# Patient Record
Sex: Male | Born: 1980
Health system: Southern US, Community
[De-identification: ages and names within clinical notes are randomized; demographics above are authoritative.]

## PROBLEM LIST (undated history)

## (undated) DIAGNOSIS — T7840XA Allergy, unspecified, initial encounter: Secondary | ICD-10-CM

## (undated) HISTORY — DX: Allergy, unspecified, initial encounter: T78.40XA

---

## 2009-03-18 ENCOUNTER — Ambulatory Visit: Payer: Self-pay | Admitting: Family Medicine

## 2009-07-21 ENCOUNTER — Ambulatory Visit: Payer: Self-pay | Admitting: Internal Medicine

## 2014-09-17 ENCOUNTER — Ambulatory Visit: Payer: Federal, State, Local not specified - PPO | Admitting: Anesthesiology

## 2014-09-17 ENCOUNTER — Encounter
Admission: RE | Disposition: A | Discharge status: Home / Self Care | Payer: Self-pay | Source: Ambulatory Visit | Attending: Gastroenterology

## 2014-09-17 ENCOUNTER — Ambulatory Visit
Admission: RE | Admit: 2014-09-17 | Discharge: 2014-09-17 | Disposition: A | Discharge status: Home / Self Care | Payer: Federal, State, Local not specified - PPO | Source: Ambulatory Visit | Attending: Gastroenterology | Admitting: Gastroenterology

## 2014-09-17 DIAGNOSIS — Z800 Family history of malignant neoplasm of digestive organs: Secondary | ICD-10-CM | POA: Diagnosis not present

## 2014-09-17 DIAGNOSIS — R197 Diarrhea, unspecified: Secondary | ICD-10-CM | POA: Diagnosis not present

## 2014-09-17 DIAGNOSIS — Z880 Allergy status to penicillin: Secondary | ICD-10-CM | POA: Insufficient documentation

## 2014-09-17 DIAGNOSIS — K648 Other hemorrhoids: Secondary | ICD-10-CM | POA: Diagnosis not present

## 2014-09-17 DIAGNOSIS — K621 Rectal polyp: Secondary | ICD-10-CM | POA: Diagnosis not present

## 2014-09-17 DIAGNOSIS — R194 Change in bowel habit: Secondary | ICD-10-CM | POA: Insufficient documentation

## 2014-09-17 DIAGNOSIS — Z882 Allergy status to sulfonamides status: Secondary | ICD-10-CM | POA: Insufficient documentation

## 2014-09-17 DIAGNOSIS — Z8379 Family history of other diseases of the digestive system: Secondary | ICD-10-CM | POA: Diagnosis not present

## 2014-09-17 DIAGNOSIS — K529 Noninfective gastroenteritis and colitis, unspecified: Secondary | ICD-10-CM | POA: Diagnosis present

## 2014-09-17 HISTORY — PX: COLONOSCOPY WITH PROPOFOL: SHX5780

## 2014-09-17 SURGERY — COLONOSCOPY WITH PROPOFOL
Anesthesia: General

## 2014-09-17 MED ORDER — MIDAZOLAM HCL 5 MG/5ML IJ SOLN
INTRAMUSCULAR | Status: DC | PRN
  Administered 2014-09-17: 2 mg via INTRAVENOUS
  Administered 2014-09-17: 1 mg via INTRAVENOUS

## 2014-09-17 MED ORDER — PROPOFOL INFUSION 10 MG/ML OPTIME
INTRAVENOUS | Status: DC | PRN
  Administered 2014-09-17: 160 ug/kg/min via INTRAVENOUS

## 2014-09-17 MED ORDER — LACTATED RINGERS IV SOLN
INTRAVENOUS | Status: DC | PRN
  Administered 2014-09-17: 11:00:00 via INTRAVENOUS

## 2014-09-17 MED ORDER — FENTANYL CITRATE (PF) 100 MCG/2ML IJ SOLN
INTRAMUSCULAR | Status: DC | PRN
  Administered 2014-09-17: 50 ug via INTRAVENOUS

## 2014-09-17 MED ORDER — PROPOFOL 10 MG/ML IV BOLUS
INTRAVENOUS | Status: DC | PRN
  Administered 2014-09-17: 60 mg via INTRAVENOUS

## 2014-09-17 MED ORDER — SODIUM CHLORIDE 0.9 % IV SOLN
INTRAVENOUS | Status: DC
  Administered 2014-09-17: 1000 mL via INTRAVENOUS

## 2014-09-17 MED ORDER — SODIUM CHLORIDE 0.9 % IV SOLN: INTRAVENOUS | Status: DC

## 2014-09-17 NOTE — Anesthesia Preprocedure Evaluation (Signed)
Anesthesia Evaluation  Patient identified by MRN, date of birth, ID band Patient awake    Reviewed: Allergy & Precautions, NPO status , Patient's Chart, lab work & pertinent test results  History of Anesthesia Complications Negative for: history of anesthetic complications  Airway Mallampati: I   Neck ROM: Full    Dental  (+) Teeth Intact   Pulmonary          Cardiovascular     Neuro/Psych    GI/Hepatic   Endo/Other    Renal/GU      Musculoskeletal   Abdominal   Peds  Hematology   Anesthesia Other Findings   Reproductive/Obstetrics                             Anesthesia Physical Anesthesia Plan  ASA: II  Anesthesia Plan: General   Post-op Pain Management:    Induction: Intravenous  Airway Management Planned: Nasal Cannula  Additional Equipment:   Intra-op Plan:   Post-operative Plan:   Informed Consent: I have reviewed the patients History and Physical, chart, labs and discussed the procedure including the risks, benefits and alternatives for the proposed anesthesia with the patient or authorized representative who has indicated his/her understanding and acceptance.     Plan Discussed with:   Anesthesia Plan Comments:         Anesthesia Quick Evaluation

## 2014-09-17 NOTE — Transfer of Care (Signed)
Immediate Anesthesia Transfer of Care Note  Patient: Matthew Huynh  Procedure(s) Performed: Procedure(s): COLONOSCOPY WITH PROPOFOL (N/A)  Patient Location: PACU  Anesthesia Type:General  Level of Consciousness: sedated  Airway & Oxygen Therapy: Patient Spontanous Breathing and Patient connected to nasal cannula oxygen  Post-op Assessment: Report given to RN and Post -op Vital signs reviewed and stable  Post vital signs: Reviewed and stable  Last Vitals:  Filed Vitals:   09/17/14 0930  BP: 140/98  Pulse: 90  Temp: 36.4 C  Resp: 16    Complications: No apparent anesthesia complications

## 2014-09-17 NOTE — Anesthesia Postprocedure Evaluation (Signed)
  Anesthesia Post-op Note  Patient: Matthew Huynh  Procedure(s) Performed: Procedure(s): COLONOSCOPY WITH PROPOFOL (N/A)  Anesthesia type:General  Patient location: PACU  Post pain: Pain level controlled  Post assessment: Post-op Vital signs reviewed, Patient's Cardiovascular Status Stable, Respiratory Function Stable, Patent Airway and No signs of Nausea or vomiting  Post vital signs: Reviewed and stable  Last Vitals:  Filed Vitals:   09/17/14 1140  BP: 130/86  Pulse: 80  Temp:   Resp: 14    Level of consciousness: awake, alert  and patient cooperative  Complications: No apparent anesthesia complications

## 2014-09-17 NOTE — Op Note (Signed)
[facility] Gastroenter[facility] Patient Name: Matthew Huynh Procedure Date: 09/17/2014 10:22 AM MRN: [id number] Account #: [id number] Date of Birth: 1980-07-08 Admit Type: Outpatient Age: 34 Room: [facility] ENDO ROOM 2 Gender: Male Note Status: Finalized Procedure:         Colonoscopy Indications:       Chronic diarrhea, Change in bowel habits Patient Profile:   This is a 34 year old male. Providers:         Rhona Raider. Shelle Iron, MD Referring MD:      Duanne Limerick, MD (Referring MD) Medicines:         Propofol per Anesthesia Complications:     No immediate complications. Procedure:         Pre-Anesthesia Assessment:                    - Prior to the procedure, a History and Physical was                     performed, and patient medications, allergies and                     sensitivities were reviewed. The patient's tolerance of                     previous anesthesia was reviewed.                    After obtaining informed consent, the colonoscope was                     passed under direct vision. Throughout the procedure, the                     patient's blood pressure, pulse, and oxygen saturations                     were monitored continuously. The Olympus CF-Q160AL                     colonoscope (S#. [id number]) was introduced through the anus                     and advanced to the the cecum, identified by appendiceal                     orifice and ileocecal valve. The colonoscopy was performed                     without difficulty. The patient tolerated the procedure                     well. The quality of the bowel preparation was excellent. Findings:      The perianal and digital rectal examinations were normal.      Internal hemorrhoids were found during retroflexion. The hemorrhoids       were Grade I (internal hemorrhoids that do not prolapse).      A 2 mm polyp was found in the rectum. The polyp was sessile. The polyp       was removed with a jumbo cold  forceps. Resection and retrieval were       complete.      The terminal ileum appeared normal.      Biopsies for histology were taken with a cold forceps from the right  colon, left colon and rectum for evaluation of microscopic colitis.      The exam was otherwise without abnormality. Impression:        - Internal hemorrhoids.                    - One 2 mm polyp in the rectum. Resected and retrieved.                    - The examined portion of the ileum was normal.                    - The examination was otherwise normal.                    - Biopsies were taken with a cold forceps from the right                     colon, left colon and rectum for evaluation of microscopic                     colitis.                    - This is likely IBS-D vs resolving post infectious IBS. Recommendation:    - Observe patient in GI recovery unit.                    - High fiber diet.                    - Continue present medications.                    - Await pathology results.                    - Return to referring physician.                    - Call GI clinic if loose stools do not continue to                     improve.                    - The findings and recommendations were discussed with the                     patient.                    - The findings and recommendations were discussed with the                     patient's family. Procedure Code(s): --- Professional ---                    [phone], Colonoscopy, flexible; with biopsy, single or                     multiple CPT copyright 2014 American Medical Association. All rights reserved. The codes documented in this report are preliminary and upon coder review may  be revised to meet current compliance requirements. Kathalene Frames, MD 09/17/2014 11:06:27 AM This report has been signed electronically. Number of Addenda: 0 Note Initiated On: 09/17/2014 10:22 AM Scope Withdrawal Time: 0 hours 11 minutes 15 seconds  Total  Procedure Duration: 0 hours 14  minutes 34 seconds       [facility]

## 2014-09-17 NOTE — Anesthesia Procedure Notes (Signed)
Date/Time: 09/17/2014 10:53 AM Performed by: Henrietta Hoover Pre-anesthesia Checklist: Patient identified, Emergency Drugs available, Suction available, Patient being monitored and Timeout performed Oxygen Delivery Method: Nasal cannula Intubation Type: IV induction Placement Confirmation: positive ETCO2

## 2014-09-17 NOTE — H&P (Signed)
  Primary Care Physician:  Elizabeth Sauer, MD  Pre-Procedure History & Physical: HPI:  Matthew Huynh is a 33 y.o. male is here for an colonoscopy.   No past medical history on file.  No past surgical history on file.  Prior to Admission medications   Medication Sig Start Date End Date Taking? Authorizing Provider  diphenhydrAMINE (BENADRYL) 25 mg capsule Take 25 mg by mouth every 6 (six) hours as needed.    Historical Provider, MD    Allergies as of 09/12/2014  . (Not on File)    No family history on file.  History   Social History  . Marital Status: Married    Spouse Name: N/A  . Number of Children: N/A  . Years of Education: N/A   Occupational History  . Not on file.   Social History Main Topics  . Smoking status: Not on file  . Smokeless tobacco: Not on file  . Alcohol Use: Not on file  . Drug Use: Not on file  . Sexual Activity: Not on file   Other Topics Concern  . Not on file   Social History Narrative  . No narrative on file     Physical Exam: BP 140/98 mmHg  Pulse 90  Temp(Src) 97.6 F (36.4 C) (Tympanic)  Resp 16  Ht  (1.854 m)  Wt 83.915 kg (185 lb)  BMI 24.41 kg/m2  SpO2 100% General:   Alert,  pleasant and cooperative in NAD Head:  Normocephalic and atraumatic. Neck:  Supple; no masses or thyromegaly. Lungs:  Clear throughout to auscultation.    Heart:  Regular rate and rhythm. Abdomen:  Soft, nontender and nondistended. Normal bowel sounds, without guarding, and without rebound.   Neurologic:  Alert and  oriented x4;  grossly normal neurologically.  Impression/Plan: Matthew Huynh is here for an colonoscopy to be performed for diarrhea  Risks, benefits, limitations, and alternatives regarding  colonoscopy have been reviewed with the patient.  Questions have been answered.  All parties agreeable.   Elnita Maxwell, MD  09/17/2014, 10:27 AM2

## 2014-09-18 LAB — SURGICAL PATHOLOGY

## 2014-09-20 ENCOUNTER — Encounter: Payer: Self-pay | Admitting: Gastroenterology

## 2015-12-20 DIAGNOSIS — G8929 Other chronic pain: Secondary | ICD-10-CM | POA: Diagnosis not present

## 2015-12-20 DIAGNOSIS — R109 Unspecified abdominal pain: Secondary | ICD-10-CM | POA: Diagnosis not present

## 2015-12-30 DIAGNOSIS — R1013 Epigastric pain: Secondary | ICD-10-CM | POA: Diagnosis not present

## 2015-12-30 DIAGNOSIS — K209 Esophagitis, unspecified: Secondary | ICD-10-CM | POA: Diagnosis not present

## 2015-12-30 DIAGNOSIS — R140 Abdominal distension (gaseous): Secondary | ICD-10-CM | POA: Diagnosis not present

## 2015-12-30 DIAGNOSIS — K449 Diaphragmatic hernia without obstruction or gangrene: Secondary | ICD-10-CM | POA: Diagnosis not present

## 2015-12-30 DIAGNOSIS — K210 Gastro-esophageal reflux disease with esophagitis: Secondary | ICD-10-CM | POA: Diagnosis not present

## 2015-12-30 DIAGNOSIS — R109 Unspecified abdominal pain: Secondary | ICD-10-CM | POA: Diagnosis not present

## 2016-04-10 DIAGNOSIS — R140 Abdominal distension (gaseous): Secondary | ICD-10-CM | POA: Diagnosis not present

## 2016-04-10 DIAGNOSIS — K625 Hemorrhage of anus and rectum: Secondary | ICD-10-CM | POA: Diagnosis not present

## 2016-04-10 DIAGNOSIS — K210 Gastro-esophageal reflux disease with esophagitis: Secondary | ICD-10-CM | POA: Diagnosis not present

## 2016-04-10 DIAGNOSIS — R1032 Left lower quadrant pain: Secondary | ICD-10-CM | POA: Diagnosis not present

## 2016-04-10 DIAGNOSIS — R143 Flatulence: Secondary | ICD-10-CM | POA: Diagnosis not present

## 2016-04-20 DIAGNOSIS — K080 Exfoliation of teeth due to systemic causes: Secondary | ICD-10-CM | POA: Diagnosis not present

## 2016-04-21 DIAGNOSIS — R140 Abdominal distension (gaseous): Secondary | ICD-10-CM | POA: Diagnosis not present

## 2016-04-21 DIAGNOSIS — R143 Flatulence: Secondary | ICD-10-CM | POA: Diagnosis not present

## 2016-04-21 DIAGNOSIS — R1032 Left lower quadrant pain: Secondary | ICD-10-CM | POA: Diagnosis not present

## 2016-05-11 DIAGNOSIS — R1032 Left lower quadrant pain: Secondary | ICD-10-CM | POA: Diagnosis not present

## 2016-05-11 DIAGNOSIS — K648 Other hemorrhoids: Secondary | ICD-10-CM | POA: Diagnosis not present

## 2017-10-04 ENCOUNTER — Ambulatory Visit: Payer: Federal, State, Local not specified - PPO | Admitting: Family Medicine

## 2017-10-04 ENCOUNTER — Encounter: Payer: Self-pay | Admitting: Family Medicine

## 2017-10-04 VITALS — BP 120/80 | HR 64 | Ht 72.0 in | Wt 186.0 lb

## 2017-10-04 DIAGNOSIS — Z7689 Persons encountering health services in other specified circumstances: Secondary | ICD-10-CM

## 2017-10-04 DIAGNOSIS — L739 Follicular disorder, unspecified: Secondary | ICD-10-CM

## 2017-10-04 DIAGNOSIS — L0100 Impetigo, unspecified: Secondary | ICD-10-CM

## 2017-10-04 MED ORDER — MUPIROCIN 2 % EX OINT: 1.0000 "application " | TOPICAL_OINTMENT | Freq: Two times a day (BID) | CUTANEOUS | 0 refills | Status: DC

## 2017-10-04 MED ORDER — DOXYCYCLINE HYCLATE 100 MG PO TABS: 100.0000 mg | ORAL_TABLET | Freq: Every day | ORAL | 2 refills | Status: DC

## 2017-10-04 NOTE — Patient Instructions (Addendum)
Folliculitis Folliculitis is inflammation of the hair follicles. Folliculitis most commonly occurs on the scalp, thighs, legs, back, and buttocks. However, it can occur anywhere on the body. What are the causes? This condition may be caused by:  A bacterial infection (common).  A fungal infection.  A viral infection.  Coming into contact with certain chemicals, especially oils and tars.  Shaving or waxing.  Applying greasy ointments or creams to your skin often.  Long-lasting folliculitis and folliculitis that keeps coming back can be caused by bacteria that live in the nostrils. What increases the risk? This condition is more likely to develop in people with:  A weakened immune system.  Diabetes.  Obesity.  What are the signs or symptoms? Symptoms of this condition include:  Redness.  Soreness.  Swelling.  Itching.  Small white or yellow, pus-filled, itchy spots (pustules) that appear over a reddened area. If there is an infection that goes deep into the follicle, these may develop into a boil (furuncle).  A group of closely packed boils (carbuncle). These tend to form in hairy, sweaty areas of the body.  How is this diagnosed? This condition is diagnosed with a skin exam. To find what is causing the condition, your health care provider may take a sample of one of the pustules or boils for testing. How is this treated? This condition may be treated by:  Applying warm compresses to the affected areas.  Taking an antibiotic medicine or applying an antibiotic medicine to the skin.  Applying or bathing with an antiseptic solution.  Taking an over-the-counter medicine to help with itching.  Having a procedure to drain any pustules or boils. This may be done if a pustule or boil contains a lot of pus or fluid.  Laser hair removal. This may be done to treat long-lasting folliculitis.  Follow these instructions at home:  If directed, apply heat to the affected  area as often as told by your health care provider. Use the heat source that your health care provider recommends, such as a moist heat pack or a heating pad. ? Place a towel between your skin and the heat source. ? Leave the heat on for 20-30 minutes. ? Remove the heat if your skin turns bright red. This is especially important if you are unable to feel pain, heat, or cold. You may have a greater risk of getting burned.  If you were prescribed an antibiotic medicine, use it as told by your health care provider. Do not stop using the antibiotic even if you start to feel better.  Take over-the-counter and prescription medicines only as told by your health care provider.  Do not shave irritated skin.  Keep all follow-up visits as told by your health care provider. This is important. Get help right away if:  You have more redness, swelling, or pain in the affected area.  Red streaks are spreading from the affected area.  You have a fever. This information is not intended to replace advice given to you by your health care provider. Make sure you discuss any questions you have with your health care provider. Document Released: 04/13/2001 Document Revised: 08/23/2015 Document Reviewed: 11/23/2014 Elsevier Interactive Patient Education  2018 [employer] Impetigo, Adult Impetigo is an infection of the skin. It commonly occurs in young children, but it can also occur in adults. The infection causes itchy blisters and sores that produce brownish-yellow fluid. As the fluid dries, it forms a thick, honey-colored crust. These skin changes usually occur  on the face but can also affect other areas of the body. Impetigo usually goes away in 7-10 days with treatment. What are the causes? Impetigo is caused by two types of bacteria. It may be caused by staphylococci or streptococci bacteria. These bacteria cause impetigo when they get under the surface of the skin. This often happens after some damage to  the skin, such as damage from:  Cuts, scrapes, or scratches.  Insect bites, especially when you scratch the area of a bite.  Chickenpox or other illnesses that cause open skin sores.  Nail biting or chewing.  Impetigo is contagious and can spread easily from one person to another. This may occur through close skin contact or by sharing towels, clothing, or other items with a person who has the infection. What increases the risk? Some things that can increase the risk of getting this infection include:  Playing sports that include skin-to-skin contact with others.  Having a skin condition with open sores.  Having many skin cuts or scrapes.  Living in an area that has high humidity levels.  Having poor hygiene.  Having high levels of staphylococci in your nose.  What are the signs or symptoms? Impetigo usually starts out as small blisters, often on the face. The blisters then break open and turn into tiny sores (lesions) with a yellow crust. In some cases, the blisters cause itching or burning. With scratching, irritation, or lack of treatment, these small lesions may get larger. Scratching can also cause impetigo to spread to other parts of the body. The bacteria can get under the fingernails and spread when you touch another area of your skin. Other possible symptoms include:  Larger blisters.  Pus.  Swollen lymph glands.  How is this diagnosed? This condition is usually diagnosed during a physical exam. A skin sample or sample of fluid from a blister may be taken for lab tests that involve growing bacteria (culture test). This can help confirm the diagnosis or help determine the best treatment. How is this treated? Mild impetigo can be treated with prescription antibiotic cream. Oral antibiotic medicine may be used in more severe cases. Medicines for itching may also be used. Follow these instructions at home:  Take medicines only as directed by your health care  provider.  To help prevent impetigo from spreading to other body areas: ? Keep your fingernails short and clean. ? Do not scratch the blisters or sores. ? Cover infected areas, if necessary, to keep from scratching.  Gently wash the infected areas with antibiotic soap and water.  Soak crusted areas in warm, soapy water using antibiotic soap. ? Gently rub the areas to remove crusts. Do not scrub.  Wash your hands often to avoid spreading this infection.  Stay home until you have used an antibiotic cream for 48 hours (2 days) or an oral antibiotic medicine for 24 hours (1 day). You should only return to work and activities with other people if your skin shows significant improvement. How is this prevented? To keep the infection from spreading:  Stay home until you have used an antibiotic cream for 48 hours or an oral antibiotic for 24 hours.  Wash your hands often.  Do not engage in skin-to-skin contact with other people while you have still have blisters.  Do not share towels, washcloths, or bedding with others while you have the infection.  Contact a health care provider if:  You develop more blisters or sores despite treatment.  Other family members get  sores.  Your skin sores are not improving after 48 hours of treatment.  You have a fever. Get help right away if:  You see spreading redness or swelling of the skin around your sores.  You see red streaks coming from your sores.  You develop a sore throat. This information is not intended to replace advice given to you by your health care provider. Make sure you discuss any questions you have with your health care provider. Document Released: 02/23/2014 Document Revised: 07/11/2015 Document Reviewed: 01/16/2014 Elsevier Interactive Patient Education  2017 [employer].

## 2017-10-04 NOTE — Progress Notes (Signed)
Name: Matthew RidgelJustin M Huynh   MRN: [id number]    DOB: 11/25/1980   Date:10/06/2017       Progress Note  Subjective  Chief Complaint  Chief Complaint  Patient presents with  . Establish Care  . sores in nose    both nostrils- been there x 2 years- goes away if on antibiotic, but then comes back    Rash  This is a chronic problem. The current episode started more than 1 year ago. The problem has been waxing and waning since onset. The affected locations include the face (intranasal). The rash is characterized by redness (tender/swelling). He was exposed to nothing. Pertinent negatives include no congestion, cough, diarrhea, fever, joint pain, shortness of breath or sore throat.    No problem-specific Assessment & Plan notes found for this encounter.   History reviewed. No pertinent past medical history.  Past Surgical History:  Procedure Laterality Date  . COLONOSCOPY WITH PROPOFOL N/A 09/17/2014   Procedure: COLONOSCOPY WITH PROPOFOL;  Surgeon: Elnita MaxwellMatthew Gordon Rein, MD;  [facility] END[facility]COPY;  Service: Endoscopy;  Laterality: N/A;    History reviewed. No pertinent family history.  Social History   Socioeconomic History  . Marital status: Married    Spouse name: Not on file  . Number of children: Not on file  . Years of education: Not on file  . Highest education level: Not on file  Occupational History  . Not on file  Social Needs  . Financial resource strain: Not on file  . Food insecurity:    Worry: Not on file    Inability: Not on file  . Transportation needs:    Medical: Not on file    Non-medical: Not on file  Tobacco Use  . Smoking status: Never Smoker  . Smokeless tobacco: Never Used  Substance and Sexual Activity  . Alcohol use: Not Currently  . Drug use: Never  . Sexual activity: Not on file  Lifestyle  . Physical activity:    Days per week: Not on file    Minutes per session: Not on file  . Stress: Not on file  Relationships  . Social connections:     Talks on phone: Not on file    Gets together: Not on file    Attends religious service: Not on file    Active member of club or organization: Not on file    Attends meetings of clubs or organizations: Not on file    Relationship status: Not on file  . Intimate partner violence:    Fear of current or ex partner: Not on file    Emotionally abused: Not on file    Physically abused: Not on file    Forced sexual activity: Not on file  Other Topics Concern  . Not on file  Social History Narrative  . Not on file    Allergies  Allergen Reactions  . Penicillins   . Sulfa Antibiotics     Outpatient Medications Prior to Visit  Medication Sig Dispense Refill  . diphenhydrAMINE (BENADRYL) 25 mg capsule Take 25 mg by mouth every 6 (six) hours as needed.     No facility-administered medications prior to visit.     Review of Systems  Constitutional: Negative for chills, fever, malaise/fatigue and weight loss.  HENT: Negative for congestion, ear discharge, ear pain and sore throat.   Eyes: Negative for blurred vision.  Respiratory: Negative for cough, sputum production, shortness of breath and wheezing.   Cardiovascular: Negative for chest pain, palpitations  and leg swelling.  Gastrointestinal: Negative for abdominal pain, blood in stool, constipation, diarrhea, heartburn, melena and nausea.  Genitourinary: Negative for dysuria, frequency, hematuria and urgency.  Musculoskeletal: Negative for back pain, joint pain, myalgias and neck pain.  Skin: Positive for rash.  Neurological: Negative for dizziness, tingling, sensory change, focal weakness and headaches.  Endo/Heme/Allergies: Negative for environmental allergies and polydipsia. Does not bruise/bleed easily.  Psychiatric/Behavioral: Negative for depression and suicidal ideas. The patient is not nervous/anxious and does not have insomnia.      Objective  Vitals:   10/04/17 1622  BP: 120/80  Pulse: 64  Weight: 186 lb (84.4 kg)   Height: 6' (1.829 m)    Physical Exam  Constitutional: He is oriented to person, place, and time.  HENT:  Head: Normocephalic.  Right Ear: Tympanic membrane, external ear and ear canal normal.  Left Ear: Tympanic membrane, external ear and ear canal normal.  Nose: Mucosal edema present.  Mouth/Throat: Oropharynx is clear and moist.  Eyes: Pupils are equal, round, and reactive to light. Conjunctivae and EOM are normal. Right eye exhibits no discharge. Left eye exhibits no discharge. No scleral icterus.  Neck: Normal range of motion. Neck supple. No JVD present. No tracheal deviation present. No thyromegaly present.  Cardiovascular: Normal rate, regular rhythm, normal heart sounds and intact distal pulses. Exam reveals no gallop and no friction rub.  No murmur heard. Pulmonary/Chest: Breath sounds normal. No respiratory distress. He has no wheezes. He has no rales.  Abdominal: Soft. Bowel sounds are normal. He exhibits no mass. There is no hepatosplenomegaly. There is no tenderness. There is no rebound, no guarding and no CVA tenderness.  Musculoskeletal: Normal range of motion. He exhibits no edema or tenderness.  Lymphadenopathy:    He has no cervical adenopathy.  Neurological: He is alert and oriented to person, place, and time. He has normal strength and normal reflexes. No cranial nerve deficit.  Skin: Skin is warm. No rash noted.  Nursing note and vitals reviewed.     Assessment & Plan  Problem List Items Addressed This Visit    None    Visit Diagnoses    Establishing care with new doctor, encounter for    -  Primary   Reestablish care   Folliculitis       Acute Currently stable. Will start doxycycline 100mg  daily with intranasal bactroban   Impetigo       Episodic Will control with doxycycline 100 mg daily.   Relevant Medications   mupirocin ointment (BACTROBAN) 2 %      Meds ordered this encounter  Medications  . doxycycline (VIBRA-TABS) 100 MG tablet    Sig:  Take 1 tablet (100 mg total) by mouth daily.    Dispense:  90 tablet    Refill:  2  . mupirocin ointment (BACTROBAN) 2 %    Sig: Apply 1 application topically 2 (two) times daily.    Dispense:  22 g    Refill:  0      Dr. Hayden Rasmusseneanna Jones [facility]  10/06/17

## 2017-10-06 ENCOUNTER — Encounter: Payer: Self-pay | Admitting: Family Medicine

## 2018-10-31 DIAGNOSIS — T1501XA Foreign body in cornea, right eye, initial encounter: Secondary | ICD-10-CM | POA: Diagnosis not present

## 2018-11-04 DIAGNOSIS — T1501XD Foreign body in cornea, right eye, subsequent encounter: Secondary | ICD-10-CM | POA: Diagnosis not present

## 2019-08-22 DIAGNOSIS — L249 Irritant contact dermatitis, unspecified cause: Secondary | ICD-10-CM | POA: Diagnosis not present

## 2019-08-22 DIAGNOSIS — L578 Other skin changes due to chronic exposure to nonionizing radiation: Secondary | ICD-10-CM | POA: Diagnosis not present

## 2019-08-22 DIAGNOSIS — L570 Actinic keratosis: Secondary | ICD-10-CM | POA: Diagnosis not present

## 2019-08-22 DIAGNOSIS — L821 Other seborrheic keratosis: Secondary | ICD-10-CM | POA: Diagnosis not present

## 2019-12-26 ENCOUNTER — Encounter: Payer: Self-pay | Admitting: Emergency Medicine

## 2019-12-26 ENCOUNTER — Ambulatory Visit
Admission: EM | Admit: 2019-12-26 | Discharge: 2019-12-26 | Disposition: A | Discharge status: Home / Self Care | Payer: Federal, State, Local not specified - PPO | Attending: Emergency Medicine | Admitting: Emergency Medicine

## 2019-12-26 ENCOUNTER — Other Ambulatory Visit: Payer: Self-pay

## 2019-12-26 DIAGNOSIS — J069 Acute upper respiratory infection, unspecified: Secondary | ICD-10-CM | POA: Insufficient documentation

## 2019-12-26 DIAGNOSIS — Z20822 Contact with and (suspected) exposure to covid-19: Secondary | ICD-10-CM | POA: Diagnosis not present

## 2019-12-26 LAB — SARS CORONAVIRUS 2 (TAT 6-24 HRS): SARS Coronavirus 2: NEGATIVE

## 2019-12-26 MED ORDER — FLUTICASONE PROPIONATE 50 MCG/ACT NA SUSP: 2.0000 | Freq: Every day | NASAL | 0 refills | Status: DC

## 2019-12-26 MED ORDER — DOXYCYCLINE HYCLATE 100 MG PO CAPS
100.0000 mg | ORAL_CAPSULE | Freq: Two times a day (BID) | ORAL | 0 refills | Status: AC
Start: 2019-12-26 — End: 2020-01-02

## 2019-12-26 NOTE — ED Triage Notes (Signed)
Patient c/o sinus congestion and fever that started last Wednesday.

## 2019-12-26 NOTE — ED Provider Notes (Signed)
HPI  SUBJECTIVE:  Matthew Huynh is a 39 y.o. male who presents with URI symptoms for the past 6 days.  He reports nasal congestion, rhinorrhea, postnasal drip, cough that is worse at night, patient with nausea, sore throat and swollen neck glands.  Sore throat/lymphadenopathy has resolved.  No documented fevers.  No body aches, headaches, sinus pain or pressure, loss of sense of smell or taste, shortness of breath.  No vomiting, diarrhea, abdominal pain.  No known Covid exposure.  He got the second dose of the moderna vaccine in March.  No facial swelling, upper dental pain.  He reports sneezing, but no itchy, watery eyes.  He tried Benadryl, TheraFlu, Mucinex.  He states the Mucinex helped.  He also took a full Z-Pak which he said helped his sore throat.  No aggravating factors.  No antipyretic in the past 6 hours. PMH negative DM, HTN, CAD, pulmonary disease, smoking. Has h/o sinusitis as child and allergy.  PMD: Duanne Limerick, MD   History reviewed. No pertinent past medical history.  Past Surgical History:  Procedure Laterality Date  . COLONOSCOPY WITH PROPOFOL N/A 09/17/2014   Procedure: COLONOSCOPY WITH PROPOFOL;  Surgeon: Elnita Maxwell, MD;  Location: [facility] ENDOSCOPY;  Service: Endoscopy;  Laterality: N/A;    Family History  Problem Relation Age of Onset  . Healthy Mother   . Healthy Father     Social History   Tobacco Use  . Smoking status: Never Smoker  . Smokeless tobacco: Never Used  Substance Use Topics  . Alcohol use: Not Currently  . Drug use: Never    No current facility-administered medications for this encounter.  Current Outpatient Medications:  .  doxycycline (VIBRAMYCIN) 100 MG capsule, Take 1 capsule (100 mg total) by mouth 2 (two) times daily for 7 days., Disp: 14 capsule, Rfl: 0 .  fluticasone (FLONASE) 50 MCG/ACT nasal spray, Place 2 sprays into both nostrils daily., Disp: 16 g, Rfl: 0  Allergies  Allergen Reactions  . Erythromycin   . Penicillins    . Sulfa Antibiotics      ROS  As noted in HPI.   Physical Exam  BP (!) 138/104 (BP Location: Right Arm)   Pulse 89   Temp 98.8 F (37.1 C) (Oral)   Resp 18   Ht 6' (1.829 m)   Wt 83.9 kg   SpO2 100%   BMI 25.09 kg/m   Constitutional: Well developed, well nourished, no acute distress Eyes:  EOMI, conjunctiva normal bilaterally HENT: Normocephalic, atraumatic,mucus membranes moist.  Positive nasal congestion.  Normal turbinates.  No maxillary, frontal sinus tenderness.  Normal tonsils without exudates.  Uvula midline.  Positive cobblestoning, postnasal drip Neck: No cervical adenopathy Respiratory: Normal inspiratory effort, clear bilaterally  Cardiovascular: Normal rate, regular rhythm no murmurs rubs or gallops GI: nondistended skin: No rash, skin intact Musculoskeletal: no deformities Neurologic: Alert & oriented x 3, no focal neuro deficits Psychiatric: Speech and behavior appropriate   ED Course   Medications - No data to display  Orders Placed This Encounter  Procedures  . SARS CORONAVIRUS 2 (TAT 6-24 HRS) Nasopharyngeal Nasopharyngeal Swab    Standing Status:   Standing    Number of Occurrences:   1    Order Specific Question:   Is this test for diagnosis or screening    Answer:   Diagnosis of ill patient    Order Specific Question:   Symptomatic for COVID-19 as defined by CDC    Answer:   Yes  Order Specific Question:   Date of Symptom Onset    Answer:   12/19/2019    Order Specific Question:   Hospitalized for COVID-19    Answer:   No    Order Specific Question:   Admitted to ICU for COVID-19    Answer:   No    Order Specific Question:   Previously tested for COVID-19    Answer:   Yes    Order Specific Question:   Resident in a congregate (group) care setting    Answer:   No    Order Specific Question:   Employed in healthcare setting    Answer:   No    Order Specific Question:   Has patient completed COVID vaccination(s) (2 doses of  Pfizer/Moderna 1 dose of [employer])    Answer:   Yes    No results found for this or any previous visit (from the past 24 hour(s)). No results found.  ED Clinical Impression  1. Upper respiratory tract infection, unspecified type   2. Encounter for laboratory testing for COVID-19 virus      ED Assessment/Plan  Patient with URI, most likely viral.  He has already completed a full Z-Pak which would take care of pharyngitis, however discussed with him that would likely not help sinusitis.  Covid PCR sent.  He will be a candidate for monoclonal antibody infusion if it is positive based on BMI.  He is to start Flonase, Mucinex or Mucinex D as long as he keeps his blood pressure, saline nasal irrigation.  Wait-and-see prescription for doxycycline.  He is to wait 4 to 5 days before starting this.  Up with PMD as needed.  Work note for today and tomorrow.  Discussed labs,  MDM, treatment plan, and plan for follow-up with patient.  patient agrees with plan.   Meds ordered this encounter  Medications  . fluticasone (FLONASE) 50 MCG/ACT nasal spray    Sig: Place 2 sprays into both nostrils daily.    Dispense:  16 g    Refill:  0  . doxycycline (VIBRAMYCIN) 100 MG capsule    Sig: Take 1 capsule (100 mg total) by mouth 2 (two) times daily for 7 days.    Dispense:  14 capsule    Refill:  0    *This clinic note was created using Scientist, clinical (histocompatibility and immunogenetics). Therefore, there may be occasional mistakes despite careful proofreading.   ?    Domenick Gong, MD 12/26/19 1040

## 2019-12-26 NOTE — Discharge Instructions (Addendum)
We will contact you if your Covid test comes back positive.,  And I will contact the monoclonal antibody infusion team to have them call you and screen you for eligibility.  In the meantime, start Flonase, Mucinex or Mucinex D as long as you keep an eye on your blood pressure, saline nasal irrigation with a Lloyd Huger med sinus rinse and distilled water as often as you want.Waldron Session prescription for doxycycline. wait 4 to 5 days before starting this

## 2020-01-09 ENCOUNTER — Other Ambulatory Visit: Payer: Self-pay

## 2020-01-09 ENCOUNTER — Encounter: Payer: Self-pay | Admitting: Emergency Medicine

## 2020-01-09 ENCOUNTER — Ambulatory Visit
Admission: EM | Admit: 2020-01-09 | Discharge: 2020-01-09 | Disposition: A | Discharge status: Home / Self Care | Payer: Federal, State, Local not specified - PPO | Attending: Physician Assistant | Admitting: Physician Assistant

## 2020-01-09 DIAGNOSIS — J309 Allergic rhinitis, unspecified: Secondary | ICD-10-CM

## 2020-01-09 DIAGNOSIS — R0981 Nasal congestion: Secondary | ICD-10-CM | POA: Diagnosis not present

## 2020-01-09 DIAGNOSIS — R0982 Postnasal drip: Secondary | ICD-10-CM | POA: Diagnosis not present

## 2020-01-09 MED ORDER — IPRATROPIUM BROMIDE 0.06 % NA SOLN: 2.0000 | Freq: Four times a day (QID) | NASAL | 12 refills | Status: DC

## 2020-01-09 MED ORDER — CLARITIN-D 24 HOUR 10-240 MG PO TB24: 1.0000 | ORAL_TABLET | Freq: Every day | ORAL | 1 refills | Status: AC

## 2020-01-09 MED ORDER — PREDNISONE 20 MG PO TABS: 40.0000 mg | ORAL_TABLET | Freq: Every day | ORAL | 0 refills | Status: AC

## 2020-01-09 NOTE — Discharge Instructions (Signed)
Begin Claritin-D, Atrovent nasal spray, increasing fluids.  You can also use over-the-counter nasal saline rinses.  Consider use of humidifier as well.  Start prednisone as well.  Would like to hold off on another antibiotic since you were just prescribed doxycycline and symptoms have not worsened.  As explained, you have many allergies so the next antibiotic choice is a very strong one.  Take medications as prescribed and if you are not feeling better in another 10 days, please return or see PCP for another exam and consideration of antibiotic at that time.  If any symptoms worsen please return sooner.

## 2020-01-09 NOTE — ED Triage Notes (Signed)
Pt c/o sinus congestion, cough nad post nasal drainage. Started about 2 weeks ago. He was seen at [facility] for this and tested for covid which was negative. He states he took the doxycycline he was given last time was here and Flonase without relief. Denies fever.

## 2020-01-09 NOTE — ED Provider Notes (Signed)
[facility]    CSN: [id number] Arrival date & time: 01/09/20  1201      History   Chief Complaint Chief Complaint  Patient presents with  . Sinus Problem    HPI Matthew Huynh is a 39 y.o. male presenting for 3-week history of sinus congestion, dry cough and postnasal drainage.  He states that his nasal drainage is mostly clear.  Denies any sinus pain.  Denies any associated fever or headaches.  Denies any fatigue and says that he does not feel that bad.  He says he is just bothered by the constant drainage.  Patient was seen in the clinic 2 weeks ago and prescribed doxycycline.  He states that he only has about 3 pills left, but is not feeling any better.  He tried the Flonase nasal spray but says it did not seem to help so he stopped taking it.  Has not taken any other medication for symptoms other than a few vitamins.  He did have a negative Covid test a couple weeks ago and denies any recent Covid exposure.  Patient mitts to issues with his sinuses, but denies frequent sinus infections or need for multiple antibiotics.  He says he does not normally have problems with allergies.  He does not take a daily antihistamine.  Patient denies any worsening of symptoms.  He denies any productive cough, chest pain or breathing difficulty.  He has no other complaints or concerns today.  HPI  History reviewed. No pertinent past medical history.  There are no problems to display for this patient.   Past Surgical History:  Procedure Laterality Date  . COLONOSCOPY WITH PROPOFOL N/A 09/17/2014   Procedure: COLONOSCOPY WITH PROPOFOL;  Surgeon: Elnita Maxwell, MD;  Location: [facility] ENDOSCOPY;  Service: Endoscopy;  Laterality: N/A;       Home Medications    Prior to Admission medications   Medication Sig Start Date End Date Taking? Authorizing Provider  ipratropium (ATROVENT) 0.06 % nasal spray Place 2 sprays into both nostrils 4 (four) times daily. 01/09/20   Shirlee Latch,  PA-C  loratadine-pseudoephedrine (CLARITIN-D 24 HOUR) 10-240 MG 24 hr tablet Take 1 tablet by mouth daily for 10 days. 01/09/20 01/19/20  Shirlee Latch, PA-C  predniSONE (DELTASONE) 20 MG tablet Take 2 tablets (40 mg total) by mouth daily for 5 days. 01/09/20 01/14/20  Eusebio Friendly B, PA-C  fluticasone (FLONASE) 50 MCG/ACT nasal spray Place 2 sprays into both nostrils daily. 12/26/19 01/09/20  Domenick Gong, MD    Family History Family History  Problem Relation Age of Onset  . Healthy Mother   . Healthy Father     Social History Social History   Tobacco Use  . Smoking status: Never Smoker  . Smokeless tobacco: Never Used  Vaping Use  . Vaping Use: Never used  Substance Use Topics  . Alcohol use: Not Currently  . Drug use: Never     Allergies   Erythromycin, Penicillins, and Sulfa antibiotics   Review of Systems Review of Systems  Constitutional: Negative for fatigue and fever.  HENT: Positive for congestion, postnasal drip and sinus pressure. Negative for sinus pain and sore throat.   Respiratory: Positive for cough. Negative for shortness of breath.   Cardiovascular: Negative for chest pain.  Gastrointestinal: Negative for abdominal pain, diarrhea, nausea and vomiting.  Musculoskeletal: Negative for myalgias.  Neurological: Negative for weakness, light-headedness and headaches.  Hematological: Negative for adenopathy.     Physical Exam Triage Vital Signs  ED Triage Vitals  Enc Vitals Group     BP 01/09/20 1231 132/86     Pulse Rate 01/09/20 1231 (!) 101     Resp --      Temp 01/09/20 1231 98.4 F (36.9 C)     Temp Source 01/09/20 1231 Oral     SpO2 01/09/20 1231 100 %     Weight 01/09/20 1228 184 lb 15.5 oz (83.9 kg)     Height 01/09/20 1228 6' (1.829 m)     Head Circumference --      Peak Flow --      Pain Score 01/09/20 1228 0     Pain Loc --      Pain Edu? --      Excl. in GC? --    No data found.  Updated Vital Signs BP 132/86 (BP Location:  Left Arm)   Pulse (!) 101   Temp 98.4 F (36.9 C) (Oral)   Ht 6' (1.829 m)   Wt 184 lb 15.5 oz (83.9 kg)   SpO2 100%   BMI 25.09 kg/m       Physical Exam Vitals and nursing note reviewed.  Constitutional:      General: He is not in acute distress.    Appearance: Normal appearance. He is well-developed. He is not ill-appearing, toxic-appearing or diaphoretic.  HENT:     Head: Normocephalic and atraumatic.     Right Ear: Tympanic membrane, ear canal and external ear normal.     Left Ear: Tympanic membrane, ear canal and external ear normal.     Nose: Congestion present. No rhinorrhea.     Right Sinus: No maxillary sinus tenderness or frontal sinus tenderness.     Left Sinus: No maxillary sinus tenderness or frontal sinus tenderness.     Mouth/Throat:     Mouth: Mucous membranes are moist.     Pharynx: Uvula midline. Posterior oropharyngeal erythema (with large amoutn of clear PND) present. No oropharyngeal exudate.     Tonsils: No tonsillar abscesses.  Eyes:     General: No scleral icterus.       Right eye: No discharge.        Left eye: No discharge.     Conjunctiva/sclera: Conjunctivae normal.  Neck:     Thyroid: No thyromegaly.     Trachea: No tracheal deviation.  Cardiovascular:     Rate and Rhythm: Normal rate and regular rhythm.     Heart sounds: Normal heart sounds.  Pulmonary:     Effort: Pulmonary effort is normal. No respiratory distress.     Breath sounds: Normal breath sounds. No stridor. No wheezing or rales.  Musculoskeletal:     Cervical back: Normal range of motion and neck supple.  Lymphadenopathy:     Cervical: No cervical adenopathy.  Skin:    General: Skin is warm and dry.     Findings: No rash.  Neurological:     General: No focal deficit present.     Mental Status: He is alert. Mental status is at baseline.     Motor: No weakness.     Gait: Gait normal.  Psychiatric:        Mood and Affect: Mood normal.        Behavior: Behavior normal.          Thought Content: Thought content normal.      UC Treatments / Results  Labs (all labs ordered are listed, but only abnormal results are displayed) Labs Reviewed - No  data to display  EKG   Radiology No results found.  Procedures Procedures (including critical care time)  Medications Ordered in UC Medications - No data to display  Initial Impression / Assessment and Plan / UC Course  I have reviewed the triage vital signs and the nursing notes.  Pertinent labs & imaging results that were available during my care of the patient were reviewed by me and considered in my medical decision making (see chart for details).   39 year old male returning for another exam today.  He was seen in the clinic 2 weeks ago by Dr. Terrilee Croak and prescribed doxycycline and Flonase nasal spray for suspected sinus infection.  He denies any improvement with use of these medications.  He denies any worsening of symptoms.  All vital signs are stable and he is afebrile.  Patient does not exhibit any sinus tenderness.  His chest is clear to auscultation.  I suspect that he likely has allergic component to his symptoms.  Patient has multiple allergies to antibiotics including penicillin, sulfa and erythromycin.  Since he did just take doxycycline the next 2 weeks for sinus infection would be Levaquin.  Since his symptoms have not worsened advised to hold off on this at this time.  Treating at this time with Claritin-D, Atrovent nasal spray, increasing fluids and prednisone.  Advise follow-up with PCP if not getting better over the next 10 days or our department.  Explained he may need to see ENT if he is not improving.  Final Clinical Impressions(s) / UC Diagnoses   Final diagnoses:  Allergic rhinitis, unspecified seasonality, unspecified trigger  Sinus congestion  Post-nasal drainage     Discharge Instructions     Begin Claritin-D, Atrovent nasal spray, increasing fluids.  You can also use  over-the-counter nasal saline rinses.  Consider use of humidifier as well.  Start prednisone as well.  Would like to hold off on another antibiotic since you were just prescribed doxycycline and symptoms have not worsened.  As explained, you have many allergies so the next antibiotic choice is a very strong one.  Take medications as prescribed and if you are not feeling better in another 10 days, please return or see PCP for another exam and consideration of antibiotic at that time.  If any symptoms worsen please return sooner.    ED Prescriptions    Medication Sig Dispense Auth. Provider   loratadine-pseudoephedrine (CLARITIN-D 24 HOUR) 10-240 MG 24 hr tablet Take 1 tablet by mouth daily for 10 days. 10 tablet Eusebio Friendly B, PA-C   ipratropium (ATROVENT) 0.06 % nasal spray Place 2 sprays into both nostrils 4 (four) times daily. 15 mL Eusebio Friendly B, PA-C   predniSONE (DELTASONE) 20 MG tablet Take 2 tablets (40 mg total) by mouth daily for 5 days. 10 tablet Gareth Morgan     PDMP not reviewed this encounter.   Shirlee Latch, PA-C 01/09/20 1302

## 2020-01-12 ENCOUNTER — Ambulatory Visit
Admission: EM | Admit: 2020-01-12 | Discharge: 2020-01-12 | Disposition: A | Discharge status: Home / Self Care | Payer: Federal, State, Local not specified - PPO | Attending: Physician Assistant | Admitting: Physician Assistant

## 2020-01-12 ENCOUNTER — Encounter: Payer: Self-pay | Admitting: Emergency Medicine

## 2020-01-12 ENCOUNTER — Other Ambulatory Visit: Payer: Self-pay

## 2020-01-12 DIAGNOSIS — J019 Acute sinusitis, unspecified: Secondary | ICD-10-CM | POA: Diagnosis not present

## 2020-01-12 DIAGNOSIS — J309 Allergic rhinitis, unspecified: Secondary | ICD-10-CM

## 2020-01-12 MED ORDER — LEVOFLOXACIN 750 MG PO TABS: 750.0000 mg | ORAL_TABLET | Freq: Every day | ORAL | 0 refills | Status: AC

## 2020-01-12 NOTE — ED Triage Notes (Signed)
Pt c/o productive cough and sinus pressure x 1 month. He states he has gotten worse since the last visit and his phlegm is yellow.

## 2020-01-12 NOTE — Discharge Instructions (Addendum)
We are treating you at this time with an antibiotic, but as I explained to you I do not believe your sinusitis is due to bacterial causes since you have failed 2 different antibiotics.  If you do not get better with Levaquin, I would be even more certain that you do not have a bacterial sinus infection.  If you fail this antibiotic you are not feeling better you will need to follow up with an ENT specialist.  There is not really anything left for [place] to try after this medication.  I would advise to continue taking Claritin-D and using the nasal sprays.

## 2020-01-12 NOTE — ED Provider Notes (Signed)
[facility]    CSN: [id number] Arrival date & time: 01/12/20  1000      History   Chief Complaint Chief Complaint  Patient presents with  . Sinus Problem  . Cough    HPI Matthew Huynh is a 39 y.o. male returning to [facility] for the third time in less than 1 month for sinus pressure and congestion.  Patient was initially seen in the clinic on 12/26/2019 and prescribed doxycycline for suspected sinusitis.  He stated that he did not feel better after taking that medication when he was last in the clinic 3 days ago.  I saw patient when he was in the clinic 3 days ago.  Based on his exam and symptoms I suspected that he likely has allergies and that is the cause for his symptoms.  He failed doxycycline and azithromycin.  He was not ill-appearing during the exam and did not have any sinus tenderness.  I treated him at that time with Claritin-D, Atrovent nasal spray, and prednisone.  He says that he did try those medications and completed the doxycycline.  Patient returns today stating that he feels worse than he did 3 days ago and that is rhinorrhea has gone from clear to yellow.  Patient says he feels like he is coughing stuff out of his chest.  He denies any chest pain or breathing difficulty.  Patient says his sinuses do not hurt that much but there is some pressure.  Denies any ear pain or sore throat.  Patient states he was like a work note to return to work on Tuesday so that he can get over "this crap."  He has no other complaints or concerns today.  HPI  History reviewed. No pertinent past medical history.  There are no problems to display for this patient.   Past Surgical History:  Procedure Laterality Date  . COLONOSCOPY WITH PROPOFOL N/A 09/17/2014   Procedure: COLONOSCOPY WITH PROPOFOL;  Surgeon: Elnita Maxwell, MD;  Location: [facility] ENDOSCOPY;  Service: Endoscopy;  Laterality: N/A;       Home Medications    Prior to Admission medications     Medication Sig Start Date End Date Taking? Authorizing Provider  ipratropium (ATROVENT) 0.06 % nasal spray Place 2 sprays into both nostrils 4 (four) times daily. 01/09/20   Shirlee Latch, PA-C  levofloxacin (LEVAQUIN) 750 MG tablet Take 1 tablet (750 mg total) by mouth daily for 5 days. 01/12/20 01/17/20  Shirlee Latch, PA-C  loratadine-pseudoephedrine (CLARITIN-D 24 HOUR) 10-240 MG 24 hr tablet Take 1 tablet by mouth daily for 10 days. 01/09/20 01/19/20  Shirlee Latch, PA-C  predniSONE (DELTASONE) 20 MG tablet Take 2 tablets (40 mg total) by mouth daily for 5 days. 01/09/20 01/14/20  Eusebio Friendly B, PA-C  fluticasone (FLONASE) 50 MCG/ACT nasal spray Place 2 sprays into both nostrils daily. 12/26/19 01/09/20  Domenick Gong, MD    Family History Family History  Problem Relation Age of Onset  . Healthy Mother   . Healthy Father     Social History Social History   Tobacco Use  . Smoking status: Never Smoker  . Smokeless tobacco: Never Used  Vaping Use  . Vaping Use: Never used  Substance Use Topics  . Alcohol use: Not Currently  . Drug use: Never     Allergies   Erythromycin, Penicillins, and Sulfa antibiotics   Review of Systems Review of Systems  Constitutional: Negative for fatigue and fever.  HENT: Positive  for congestion, rhinorrhea and sinus pressure. Negative for sinus pain and sore throat.   Respiratory: Positive for cough. Negative for shortness of breath.   Gastrointestinal: Negative for abdominal pain, diarrhea, nausea and vomiting.  Musculoskeletal: Negative for myalgias.  Neurological: Negative for weakness, light-headedness and headaches.  Hematological: Negative for adenopathy.     Physical Exam Triage Vital Signs ED Triage Vitals  Enc Vitals Group     BP 01/12/20 1035 (!) 138/97     Pulse Rate 01/12/20 1035 95     Resp --      Temp 01/12/20 1035 99.3 F (37.4 C)     Temp Source 01/12/20 1035 Oral     SpO2 01/12/20 1035 99 %     Weight --       Height --      Head Circumference --      Peak Flow --      Pain Score 01/12/20 1033 2     Pain Loc --      Pain Edu? --      Excl. in GC? --    No data found.  Updated Vital Signs BP (!) 138/97 (BP Location: Right Arm)   Pulse 95   Temp 99.3 F (37.4 C) (Oral)   SpO2 99%       Physical Exam Vitals and nursing note reviewed.  Constitutional:      General: He is not in acute distress.    Appearance: He is well-developed. He is not diaphoretic.  HENT:     Head: Normocephalic and atraumatic.     Right Ear: Tympanic membrane, ear canal and external ear normal.     Left Ear: Tympanic membrane, ear canal and external ear normal.     Nose: Rhinorrhea (trace yellow drainage) present.     Mouth/Throat:     Pharynx: Uvula midline. Posterior oropharyngeal erythema (mild with clear PND) present. No oropharyngeal exudate.     Tonsils: No tonsillar abscesses.  Eyes:     General: No scleral icterus.       Right eye: No discharge.        Left eye: No discharge.     Conjunctiva/sclera: Conjunctivae normal.     Pupils: Pupils are equal, round, and reactive to light.  Neck:     Thyroid: No thyromegaly.     Trachea: No tracheal deviation.  Cardiovascular:     Rate and Rhythm: Normal rate and regular rhythm.     Heart sounds: Normal heart sounds.  Pulmonary:     Effort: Pulmonary effort is normal. No respiratory distress.     Breath sounds: Normal breath sounds. No stridor. No wheezing or rales.  Chest:     Chest wall: No tenderness.  Musculoskeletal:     Cervical back: Normal range of motion and neck supple.  Lymphadenopathy:     Cervical: No cervical adenopathy.  Skin:    General: Skin is warm and dry.     Findings: No rash.  Neurological:     Mental Status: He is alert.      UC Treatments / Results  Labs (all labs ordered are listed, but only abnormal results are displayed) Labs Reviewed - No data to display  EKG   Radiology No results  found.  Procedures Procedures (including critical care time)  Medications Ordered in UC Medications - No data to display  Initial Impression / Assessment and Plan / UC Course  I have reviewed the triage vital signs and the nursing notes.  Pertinent labs & imaging results that were available during my care of the patient were reviewed by me and considered in my medical decision making (see chart for details).   39 year old male with 3-1/2-week history of sinus congestion and pressure.  He has been seen in the clinic 3 times for his symptoms.  He has already been treated with doxycycline and azithromycin before that from a different provider.  He has tried Flonase, Claritin-D, prednisone, and Atrovent nasal spray.  Patient states that none of the above medications have helped at all.  He states that he feels worse than he did 3 days ago.  All vital signs are stable.  Patient is afebrile.  Patient says that he has had new yellowish drainage from his nose and productive cough over the past couple days.  He is concerned that he has continued sinus infection and needs more antibiotics.  Discussed with patient that I am not certain he has a bacterial sinus infection and it is likely more due to allergies.  We will treat with Levaquin at that time since there are no other medications to consider given his allergies and medications that he is already tried.  5-day course of Levaquin prescribed.  Advised that if he is not feeling better he needs to follow-up with ENT.   Final Clinical Impressions(s) / UC Diagnoses   Final diagnoses:  Acute sinusitis, recurrence not specified, unspecified location  Allergic rhinitis, unspecified seasonality, unspecified trigger     Discharge Instructions     We are treating you at this time with an antibiotic, but as I explained to you I do not believe your sinusitis is due to bacterial causes since you have failed 2 different antibiotics.  If you do not get better  with Levaquin, I would be even more certain that you do not have a bacterial sinus infection.  If you fail this antibiotic you are not feeling better you will need to follow up with an ENT specialist.  There is not really anything left for [place] to try after this medication.  I would advise to continue taking Claritin-D and using the nasal sprays.    ED Prescriptions    Medication Sig Dispense Auth. Provider   levofloxacin (LEVAQUIN) 750 MG tablet Take 1 tablet (750 mg total) by mouth daily for 5 days. 5 tablet Gareth Morgan     PDMP not reviewed this encounter.   Shirlee Latch, PA-C 01/12/20 1111

## 2020-01-15 DIAGNOSIS — R000 Tachycardia, unspecified: Secondary | ICD-10-CM | POA: Diagnosis not present

## 2020-01-15 DIAGNOSIS — Z79899 Other long term (current) drug therapy: Secondary | ICD-10-CM | POA: Diagnosis not present

## 2020-01-15 DIAGNOSIS — Z880 Allergy status to penicillin: Secondary | ICD-10-CM | POA: Diagnosis not present

## 2020-01-15 DIAGNOSIS — R509 Fever, unspecified: Secondary | ICD-10-CM | POA: Diagnosis not present

## 2020-01-15 DIAGNOSIS — R0789 Other chest pain: Secondary | ICD-10-CM | POA: Diagnosis not present

## 2020-01-15 DIAGNOSIS — R059 Cough, unspecified: Secondary | ICD-10-CM | POA: Diagnosis not present

## 2020-01-15 DIAGNOSIS — R0602 Shortness of breath: Secondary | ICD-10-CM | POA: Diagnosis not present

## 2020-01-15 DIAGNOSIS — Z881 Allergy status to other antibiotic agents status: Secondary | ICD-10-CM | POA: Diagnosis not present

## 2020-01-15 DIAGNOSIS — I444 Left anterior fascicular block: Secondary | ICD-10-CM | POA: Diagnosis not present

## 2020-01-15 DIAGNOSIS — U071 COVID-19: Secondary | ICD-10-CM | POA: Diagnosis not present

## 2020-01-15 DIAGNOSIS — J329 Chronic sinusitis, unspecified: Secondary | ICD-10-CM | POA: Diagnosis not present

## 2020-01-15 DIAGNOSIS — R110 Nausea: Secondary | ICD-10-CM | POA: Diagnosis not present

## 2020-01-15 DIAGNOSIS — Z792 Long term (current) use of antibiotics: Secondary | ICD-10-CM | POA: Diagnosis not present

## 2020-01-15 DIAGNOSIS — Z7952 Long term (current) use of systemic steroids: Secondary | ICD-10-CM | POA: Diagnosis not present

## 2020-01-16 DIAGNOSIS — U071 COVID-19: Secondary | ICD-10-CM | POA: Insufficient documentation

## 2020-01-17 DIAGNOSIS — U071 COVID-19: Secondary | ICD-10-CM | POA: Diagnosis not present

## 2020-01-17 DIAGNOSIS — Z23 Encounter for immunization: Secondary | ICD-10-CM | POA: Diagnosis not present

## 2020-01-31 DIAGNOSIS — R0982 Postnasal drip: Secondary | ICD-10-CM | POA: Diagnosis not present

## 2020-01-31 DIAGNOSIS — J305 Allergic rhinitis due to food: Secondary | ICD-10-CM | POA: Diagnosis not present

## 2020-01-31 DIAGNOSIS — J019 Acute sinusitis, unspecified: Secondary | ICD-10-CM | POA: Diagnosis not present

## 2020-01-31 DIAGNOSIS — J309 Allergic rhinitis, unspecified: Secondary | ICD-10-CM | POA: Diagnosis not present

## 2020-01-31 DIAGNOSIS — K219 Gastro-esophageal reflux disease without esophagitis: Secondary | ICD-10-CM | POA: Diagnosis not present

## 2020-02-02 DIAGNOSIS — J342 Deviated nasal septum: Secondary | ICD-10-CM | POA: Diagnosis not present

## 2020-02-06 ENCOUNTER — Encounter: Payer: Self-pay | Admitting: Family Medicine

## 2020-02-06 ENCOUNTER — Other Ambulatory Visit: Payer: Self-pay

## 2020-02-06 ENCOUNTER — Ambulatory Visit: Payer: Federal, State, Local not specified - PPO | Admitting: Family Medicine

## 2020-02-06 VITALS — BP 120/70 | HR 88 | Ht 72.0 in | Wt 200.0 lb

## 2020-02-06 DIAGNOSIS — R0982 Postnasal drip: Secondary | ICD-10-CM

## 2020-02-06 DIAGNOSIS — J309 Allergic rhinitis, unspecified: Secondary | ICD-10-CM

## 2020-02-06 DIAGNOSIS — Z7689 Persons encountering health services in other specified circumstances: Secondary | ICD-10-CM

## 2020-02-06 MED ORDER — AZELASTINE HCL 0.1 % NA SOLN: 1.0000 | Freq: Two times a day (BID) | NASAL | 12 refills | Status: DC

## 2020-02-06 NOTE — Progress Notes (Signed)
Date:  02/06/2020   Name:  Matthew Huynh   DOB:  Dec 25, 1980   MRN:  [id number]   Chief Complaint: Establish Care and Sinusitis (Had a sinus infection in October- had ZPack, then Doxy and then loratadine/ Sudafed. Saw ENT- they did CT scan and blood work. No allergies or polyps. )  Sinusitis This is a chronic problem. The current episode started more than 1 month ago. The problem has been waxing and waning since onset. There has been no fever. He is experiencing no pain. Pertinent negatives include no chills, congestion, coughing, diaphoresis, ear pain, headaches, hoarse voice, neck pain, shortness of breath, sinus pressure, sneezing, sore throat or swollen glands. Past treatments include oral decongestants (antihiatamines). The treatment provided mild relief.  URI  This is a chronic problem. The current episode started more than 1 month ago. The problem has been unchanged. There has been no fever. Associated symptoms include rhinorrhea. Pertinent negatives include no abdominal pain, chest pain, congestion, coughing, diarrhea, dysuria, ear pain, headaches, nausea, neck pain, rash, sneezing, sore throat, swollen glands or wheezing.    No results found for: CREATININE, BUN, NA, K, CL, CO2 No results found for: CHOL, HDL, LDLCALC, LDLDIRECT, TRIG, CHOLHDL No results found for: TSH No results found for: HGBA1C No results found for: WBC, HGB, HCT, MCV, PLT No results found for: ALT, AST, GGT, ALKPHOS, BILITOT   Review of Systems  Constitutional: Negative for chills, diaphoresis and fever.  HENT: Positive for rhinorrhea. Negative for congestion, drooling, ear discharge, ear pain, hoarse voice, sinus pressure, sneezing and sore throat.   Respiratory: Negative for cough, shortness of breath and wheezing.   Cardiovascular: Negative for chest pain, palpitations and leg swelling.  Gastrointestinal: Negative for abdominal pain, blood in stool, constipation, diarrhea and nausea.  Endocrine:  Negative for polydipsia.  Genitourinary: Negative for dysuria, frequency, hematuria and urgency.  Musculoskeletal: Negative for back pain, myalgias and neck pain.  Skin: Negative for rash.  Allergic/Immunologic: Negative for environmental allergies.  Neurological: Negative for dizziness and headaches.  Hematological: Does not bruise/bleed easily.  Psychiatric/Behavioral: Negative for suicidal ideas. The patient is not nervous/anxious.     There are no problems to display for this patient.   Allergies  Allergen Reactions  . Erythromycin   . Penicillins   . Sulfa Antibiotics     Past Surgical History:  Procedure Laterality Date  . COLONOSCOPY WITH PROPOFOL N/A 09/17/2014   Procedure: COLONOSCOPY WITH PROPOFOL;  Surgeon: Elnita Maxwell, MD;  Location: [facility] ENDOSCOPY;  Service: Endoscopy;  Laterality: N/A;    Social History   Tobacco Use  . Smoking status: Never Smoker  . Smokeless tobacco: Never Used  Vaping Use  . Vaping Use: Never used  Substance Use Topics  . Alcohol use: Not Currently  . Drug use: Never     Medication list has been reviewed and updated.  Current Meds  Medication Sig  . Fexofenadine HCl (MUCINEX ALLERGY PO) Take 1 capsule by mouth daily.  Marland Kitchen ipratropium (ATROVENT) 0.06 % nasal spray Place 2 sprays into both nostrils 4 (four) times daily.    PHQ 2/9 Scores 02/06/2020 10/04/2017  PHQ - 2 Score 0 0  PHQ- 9 Score 0 0    GAD 7 : Generalized Anxiety Score 02/06/2020  Nervous, Anxious, on Edge 0  Control/stop worrying 0  Worry too much - different things 0  Trouble relaxing 0  Restless 0  Easily annoyed or irritable 0  Afraid - awful might happen 0  Total GAD 7 Score 0    BP Readings from Last 3 Encounters:  02/06/20 120/70  01/12/20 (!) 138/97  01/09/20 132/86    Physical Exam Vitals and nursing note reviewed.  HENT:     Head: Normocephalic.     Right Ear: Tympanic membrane and external ear normal.     Left Ear: Tympanic membrane  and external ear normal.     Nose: Congestion present. No rhinorrhea.     Mouth/Throat:     Mouth: Oropharynx is clear and moist.  Eyes:     General: No scleral icterus.       Right eye: No discharge.        Left eye: No discharge.     Extraocular Movements: EOM normal.     Conjunctiva/sclera: Conjunctivae normal.     Pupils: Pupils are equal, round, and reactive to light.  Neck:     Thyroid: No thyromegaly.     Vascular: No JVD.     Trachea: No tracheal deviation.  Cardiovascular:     Rate and Rhythm: Normal rate and regular rhythm.     Pulses: Intact distal pulses.     Heart sounds: Normal heart sounds. No murmur heard. No friction rub. No gallop.   Pulmonary:     Effort: No respiratory distress.     Breath sounds: Normal breath sounds. No wheezing, rhonchi or rales.  Abdominal:     General: Bowel sounds are normal.     Palpations: Abdomen is soft. There is no hepatosplenomegaly or mass.     Tenderness: There is no abdominal tenderness. There is no CVA tenderness, guarding or rebound.  Musculoskeletal:        General: No tenderness or edema. Normal range of motion.     Cervical back: Normal range of motion and neck supple.  Lymphadenopathy:     Cervical: No cervical adenopathy.  Skin:    General: Skin is warm.     Findings: No rash.  Neurological:     Mental Status: He is alert and oriented to person, place, and time.     Cranial Nerves: No cranial nerve deficit.     Deep Tendon Reflexes: Strength normal and reflexes are normal and symmetric.     Wt Readings from Last 3 Encounters:  02/06/20 200 lb (90.7 kg)  01/09/20 184 lb 15.5 oz (83.9 kg)  12/26/19 185 lb (83.9 kg)    BP 120/70   Pulse 88   Ht 6' (1.829 m)   Wt 200 lb (90.7 kg)   BMI 27.12 kg/m   Assessment and Plan:                                                    Patient's chart was reviewed but information was care seen that the majority of his work-up has been with [facility] and  this is not available to epic. 1. Establishing care with new doctor, encounter for In establishing care with new physician.  Patient's chart was reviewed for previous encounters, most recent labs, most recent imaging, and care everywhere.  2. Allergic rhinitis with postnasal drip Chronic.  Persistent.  Examination is consistent with an allergen related concern.  We will refer her back to [facility] for reevaluation in the meantime we have initiated Astelin nasal spray trial.  Patient does have an appointment to see Dr. Serafina Royals on January 12. - azelastine (ASTELIN) 0.1 % nasal spray; Place 1 spray into both nostrils 2 (two) times daily. Use in each nostril as directed  Dispense: 30 mL; Refill: 12

## 2020-02-06 NOTE — Patient Instructions (Addendum)
Postnasal Drip Postnasal drip is the feeling of mucus going down the back of your throat. Mucus is a slimy substance that moistens and cleans your nose and throat, as well as the air pockets in face bones near your forehead and cheeks (sinuses). Small amounts of mucus pass from your nose and sinuses down the back of your throat all the time. This is normal. When you produce too much mucus or the mucus gets too thick, you can feel it. Some common causes of postnasal drip include:  Having more mucus because of: ? A cold or the flu. ? Allergies. ? Cold air. ? Certain medicines.  Having more mucus that is thicker because of: ? A sinus or nasal infection. ? Dry air. ? A food allergy. Follow these instructions at home: Relieving discomfort   Gargle with a salt-water mixture 3-4 times a day or as needed. To make a salt-water mixture, completely dissolve -1 tsp of salt in 1 cup of warm water.  If the air in your home is dry, use a humidifier to add moisture to the air.  Use a saline spray or container (neti pot) to flush out the nose (nasal irrigation). These methods can help clear away mucus and keep the nasal passages moist. General instructions  Take over-the-counter and prescription medicines only as told by your health care provider.  Follow instructions from your health care provider about eating or drinking restrictions. You may need to avoid caffeine.  Avoid things that you know you are allergic to (allergens), like dust, mold, pollen, pets, or certain foods.  Drink enough fluid to keep your urine pale yellow.  Keep all follow-up visits as told by your health care provider. This is important. Contact a health care provider if:  You have a fever.  You have a sore throat.  You have difficulty swallowing.  You have headache.  You have sinus pain.  You have a cough that does not go away.  The mucus from your nose becomes thick and is green or yellow in color.  You have  cold or flu symptoms that last more than 10 days. Summary  Postnasal drip is the feeling of mucus going down the back of your throat.  If your health care provider approves, use nasal irrigation or a nasal spray 2?4 times a day.  Avoid things that you know you are allergic to (allergens), like dust, mold, pollen, pets, or certain foods. This information is not intended to replace advice given to you by your health care provider. Make sure you discuss any questions you have with your health care provider. Document Revised: 05/27/2018 Document Reviewed: 05/18/2016 Elsevier Patient Education  2020 [employer].    Why follow it? Research shows. . Those who follow the Mediterranean diet have a reduced risk of heart disease  . The diet is associated with a reduced incidence of Parkinson's and Alzheimer's diseases . People following the diet may have longer life expectancies and lower rates of chronic diseases  . The Dietary Guidelines for Americans recommends the Mediterranean diet as an eating plan to promote health and prevent disease  What Is the Mediterranean Diet?  . Healthy eating plan based on typical foods and recipes of Mediterranean-style cooking . The diet is primarily a plant based diet; these foods should make up a majority of meals   Starches - Plant based foods should make up a majority of meals - They are an important sources of vitamins, minerals, energy, antioxidants, and fiber -  Choose whole grains, foods high in fiber and minimally processed items  - Typical grain sources include wheat, oats, barley, corn, brown rice, bulgar, farro, millet, polenta, couscous  - Various types of beans include chickpeas, lentils, fava beans, black beans, white beans   Fruits  Veggies - Large quantities of antioxidant rich fruits & veggies; 6 or more servings  - Vegetables can be eaten raw or lightly drizzled with oil and cooked  - Vegetables common to the traditional Mediterranean Diet  include: artichokes, arugula, beets, broccoli, brussel sprouts, cabbage, carrots, celery, collard greens, cucumbers, eggplant, kale, leeks, lemons, lettuce, mushrooms, okra, onions, peas, peppers, potatoes, pumpkin, radishes, rutabaga, shallots, spinach, sweet potatoes, turnips, zucchini - Fruits common to the Mediterranean Diet include: apples, apricots, avocados, cherries, clementines, dates, figs, grapefruits, grapes, melons, nectarines, oranges, peaches, pears, pomegranates, strawberries, tangerines  Fats - Replace butter and margarine with healthy oils, such as olive oil, canola oil, and tahini  - Limit nuts to no more than a handful a day  - Nuts include walnuts, almonds, pecans, pistachios, pine nuts  - Limit or avoid candied, honey roasted or heavily salted nuts - Olives are central to the [employer] - can be eaten whole or used in a variety of dishes   Meats Protein - Limiting red meat: no more than a few times a month - When eating red meat: choose lean cuts and keep the portion to the size of deck of cards - Eggs: approx. 0 to 4 times a week  - Fish and lean poultry: at least 2 a week  - Healthy protein sources include, chicken, [place], lean beef, lamb - Increase intake of seafood such as tuna, salmon, trout, mackerel, shrimp, scallops - Avoid or limit high fat processed meats such as sausage and bacon  Dairy - Include moderate amounts of low fat dairy products  - Focus on healthy dairy such as fat free yogurt, skim milk, low or reduced fat cheese - Limit dairy products higher in fat such as whole or 2% milk, cheese, ice cream  Alcohol - Moderate amounts of red wine is ok  - No more than 5 oz daily for women (all ages) and men older than age 13  - No more than 10 oz of wine daily for men younger than 62  Other - Limit sweets and other desserts  - Use herbs and spices instead of salt to flavor foods  - Herbs and spices common to the traditional Mediterranean Diet include:  basil, bay leaves, chives, cloves, cumin, fennel, garlic, lavender, marjoram, mint, oregano, parsley, pepper, rosemary, sage, savory, sumac, tarragon, thyme   It's not just a diet, it's a lifestyle:  . The Mediterranean diet includes lifestyle factors typical of those in the region  . Foods, drinks and meals are best eaten with others and savored . Daily physical activity is important for overall good health . This could be strenuous exercise like running and aerobics . This could also be more leisurely activities such as walking, housework, yard-work, or taking the stairs . Moderation is the key; a balanced and healthy diet accommodates most foods and drinks . Consider portion sizes and frequency of consumption of certain foods   Meal Ideas & Options:  . Breakfast:  o Whole wheat toast or whole wheat English muffins with peanut butter & hard boiled egg o Steel cut oats topped with apples & cinnamon and skim milk  o Fresh fruit: banana, strawberries, melon, berries, peaches  o Smoothies: strawberries, bananas,  greek yogurt, peanut butter o Low fat greek yogurt with blueberries and granola  o Egg white omelet with spinach and mushrooms o Breakfast couscous: whole wheat couscous, apricots, skim milk, cranberries  . Sandwiches:  o Hummus and grilled vegetables (peppers, zucchini, squash) on whole wheat bread   o Grilled chicken on whole wheat pita with lettuce, tomatoes, cucumbers or tzatziki  o Tuna salad on whole wheat bread: tuna salad made with greek yogurt, olives, red peppers, capers, green onions o Garlic rosemary lamb pita: lamb sauted with garlic, rosemary, salt & pepper; add lettuce, cucumber, greek yogurt to pita - flavor with lemon juice and black pepper  . Seafood:  o Mediterranean grilled salmon, seasoned with garlic, basil, parsley, lemon juice and black pepper o Shrimp, lemon, and spinach whole-grain pasta salad made with low fat greek yogurt  o Seared scallops with lemon  orzo  o Seared tuna steaks seasoned salt, pepper, coriander topped with tomato mixture of olives, tomatoes, olive oil, minced garlic, parsley, green onions and cappers  . Meats:  o Herbed greek chicken salad with kalamata olives, cucumber, feta  o Red bell peppers stuffed with spinach, bulgur, lean ground beef (or lentils) & topped with feta   o Kebabs: skewers of chicken, tomatoes, onions, zucchini, squash  o [place] burgers: made with red onions, mint, dill, lemon juice, feta cheese topped with roasted red peppers . Vegetarian o Cucumber salad: cucumbers, artichoke hearts, celery, red onion, feta cheese, tossed in olive oil & lemon juice  o Hummus and whole grain pita points with a greek salad (lettuce, tomato, feta, olives, cucumbers, red onion) o Lentil soup with celery, carrots made with vegetable broth, garlic, salt and pepper  o Tabouli salad: parsley, bulgur, mint, scallions, cucumbers, tomato, radishes, lemon juice, olive oil, salt and pepper.

## 2020-02-28 DIAGNOSIS — H6123 Impacted cerumen, bilateral: Secondary | ICD-10-CM | POA: Diagnosis not present

## 2020-02-28 DIAGNOSIS — J301 Allergic rhinitis due to pollen: Secondary | ICD-10-CM | POA: Diagnosis not present

## 2020-02-28 DIAGNOSIS — J342 Deviated nasal septum: Secondary | ICD-10-CM | POA: Diagnosis not present

## 2020-02-28 DIAGNOSIS — J323 Chronic sphenoidal sinusitis: Secondary | ICD-10-CM | POA: Diagnosis not present

## 2020-03-13 DIAGNOSIS — J342 Deviated nasal septum: Secondary | ICD-10-CM | POA: Diagnosis not present

## 2020-03-13 DIAGNOSIS — J301 Allergic rhinitis due to pollen: Secondary | ICD-10-CM | POA: Diagnosis not present

## 2020-03-13 DIAGNOSIS — J323 Chronic sphenoidal sinusitis: Secondary | ICD-10-CM | POA: Diagnosis not present

## 2020-04-04 DIAGNOSIS — J301 Allergic rhinitis due to pollen: Secondary | ICD-10-CM | POA: Diagnosis not present

## 2021-01-01 ENCOUNTER — Ambulatory Visit: Payer: Federal, State, Local not specified - PPO | Admitting: Family Medicine

## 2021-01-01 ENCOUNTER — Other Ambulatory Visit: Payer: Self-pay

## 2021-01-01 ENCOUNTER — Encounter: Payer: Self-pay | Admitting: Family Medicine

## 2021-01-01 ENCOUNTER — Other Ambulatory Visit: Payer: Self-pay | Admitting: Family Medicine

## 2021-01-01 VITALS — BP 118/78 | HR 114 | Temp 99.5°F | Ht 72.0 in | Wt 194.0 lb

## 2021-01-01 DIAGNOSIS — J0100 Acute maxillary sinusitis, unspecified: Secondary | ICD-10-CM

## 2021-01-01 DIAGNOSIS — J309 Allergic rhinitis, unspecified: Secondary | ICD-10-CM

## 2021-01-01 DIAGNOSIS — R509 Fever, unspecified: Secondary | ICD-10-CM | POA: Diagnosis not present

## 2021-01-01 LAB — POCT INFLUENZA A/B
Influenza A, POC: NEGATIVE
Influenza B, POC: NEGATIVE

## 2021-01-01 LAB — POC COVID19 BINAXNOW: SARS Coronavirus 2 Ag: NEGATIVE

## 2021-01-01 MED ORDER — AZELASTINE HCL 0.1 % NA SOLN: 1.0000 | Freq: Two times a day (BID) | NASAL | 2 refills | Status: DC

## 2021-01-01 MED ORDER — DOXYCYCLINE HYCLATE 100 MG PO TABS: 100.0000 mg | ORAL_TABLET | Freq: Two times a day (BID) | ORAL | 0 refills | Status: DC

## 2021-01-01 NOTE — Telephone Encounter (Signed)
Requested medications are due for refill today.  yes  Requested medications are on the active medications list.  yes  Last refill. 01/09/2020  Future visit scheduled.   no  Notes to clinic.  Medication not assigned a protocol. Rx was signed by Curley Spice PA.

## 2021-01-01 NOTE — Progress Notes (Signed)
Date:  01/01/2021   Name:  Matthew Huynh   DOB:  [birth year]   MRN:  [id number]   Chief Complaint: No chief complaint on file.  Sinusitis This is a new problem. The current episode started in the past 7 days. The problem has been waxing and waning since onset. There has been no fever. The pain is moderate. Associated symptoms include congestion, coughing, ear pain (right), headaches, sinus pressure, sneezing and a sore throat. Pertinent negatives include no chills, neck pain or shortness of breath. Treatments tried: benrdryl/theraflu.   No results found for: CREATININE, BUN, NA, K, CL, CO2 No results found for: CHOL, HDL, LDLCALC, LDLDIRECT, TRIG, CHOLHDL No results found for: TSH No results found for: HGBA1C No results found for: WBC, HGB, HCT, MCV, PLT No results found for: ALT, AST, GGT, ALKPHOS, BILITOT   Review of Systems  Constitutional:  Negative for chills and fever.  HENT:  Positive for congestion, ear pain (right), sinus pressure, sneezing and sore throat. Negative for drooling and ear discharge.   Respiratory:  Positive for cough. Negative for shortness of breath and wheezing.   Cardiovascular:  Negative for chest pain, palpitations and leg swelling.  Gastrointestinal:  Negative for abdominal pain, blood in stool, constipation, diarrhea and nausea.  Endocrine: Negative for polydipsia.  Genitourinary:  Negative for dysuria, frequency, hematuria and urgency.  Musculoskeletal:  Negative for back pain, myalgias and neck pain.  Skin:  Negative for rash.  Allergic/Immunologic: Negative for environmental allergies.  Neurological:  Positive for headaches. Negative for dizziness.  Hematological:  Does not bruise/bleed easily.  Psychiatric/Behavioral:  Negative for suicidal ideas. The patient is not nervous/anxious.    There are no problems to display for this patient.   Allergies  Allergen Reactions   Erythromycin    Penicillins    Sulfa Antibiotics     Past Surgical  History:  Procedure Laterality Date   COLONOSCOPY WITH PROPOFOL N/A 09/17/2014   Procedure: COLONOSCOPY WITH PROPOFOL;  Surgeon: Elnita Maxwell, MD;  Location: [facility] ENDOSCOPY;  Service: Endoscopy;  Laterality: N/A;    Social History   Tobacco Use   Smoking status: Never   Smokeless tobacco: Never  Vaping Use   Vaping Use: Never used  Substance Use Topics   Alcohol use: Not Currently   Drug use: Never     Medication list has been reviewed and updated.  No outpatient medications have been marked as taking for the 01/01/21 encounter (Office Visit) with Duanne Limerick, MD.    [facility] 2/9 Scores 02/06/2020 10/04/2017  PHQ - 2 Score 0 0  PHQ- 9 Score 0 0    GAD 7 : Generalized Anxiety Score 02/06/2020  Nervous, Anxious, on Edge 0  Control/stop worrying 0  Worry too much - different things 0  Trouble relaxing 0  Restless 0  Easily annoyed or irritable 0  Afraid - awful might happen 0  Total GAD 7 Score 0    BP Readings from Last 3 Encounters:  02/06/20 120/70  01/12/20 (!) 138/97  01/09/20 132/86    Physical Exam Vitals and nursing note reviewed.  HENT:     Head: Normocephalic.     Right Ear: Tympanic membrane and external ear normal.     Left Ear: Tympanic membrane and external ear normal.     Nose: Congestion present. No rhinorrhea.     Right Turbinates: Swollen. Not enlarged.     Left Turbinates: Not enlarged or swollen.     Right Sinus:  Maxillary sinus tenderness present. No frontal sinus tenderness.     Left Sinus: Maxillary sinus tenderness present. No frontal sinus tenderness.  Eyes:     General: No scleral icterus.       Right eye: No discharge.        Left eye: No discharge.     Conjunctiva/sclera: Conjunctivae normal.     Pupils: Pupils are equal, round, and reactive to light.  Neck:     Thyroid: No thyromegaly.     Vascular: No JVD.     Trachea: No tracheal deviation.  Cardiovascular:     Rate and Rhythm: Normal rate and regular rhythm.      Heart sounds: Normal heart sounds. No murmur heard.   No friction rub. No gallop.  Pulmonary:     Effort: No respiratory distress.     Breath sounds: Normal breath sounds. No wheezing, rhonchi or rales.  Abdominal:     General: Bowel sounds are normal.     Palpations: Abdomen is soft. There is no mass.     Tenderness: There is no abdominal tenderness. There is no guarding or rebound.  Musculoskeletal:        General: No tenderness. Normal range of motion.     Cervical back: Normal range of motion and neck supple.  Lymphadenopathy:     Cervical: No cervical adenopathy.  Skin:    General: Skin is warm.     Findings: No rash.  Neurological:     Mental Status: He is alert and oriented to person, place, and time.     Cranial Nerves: No cranial nerve deficit.     Deep Tendon Reflexes: Reflexes are normal and symmetric.    Wt Readings from Last 3 Encounters:  02/06/20 200 lb (90.7 kg)  01/09/20 184 lb 15.5 oz (83.9 kg)  12/26/19 185 lb (83.9 kg)    There were no vitals taken for this visit.  Assessment and Plan:  1. Fever, unspecified fever cause New onset.  Persistent.  Patient's had off-and-on fever for 24 to 48 hours.  Checking of COVID and influenza is negative.  Other symptoms would suggest that there is a sinus concern. - POC COVID-19 BinaxNow - POCT Influenza A/B  2. Acute non-recurrent maxillary sinusitis Acute.  Recurrent.  Stable.  Patient has discomfort in the maxillary sinuses bilateral with tenderness on examination.  We will treat with doxycycline 100 mg twice a day. - doxycycline (VIBRA-TABS) 100 MG tablet; Take 1 tablet (100 mg total) by mouth 2 (two) times daily.  Dispense: 20 tablet; Refill: 0

## 2021-01-01 NOTE — Telephone Encounter (Signed)
Patient will need an office visit for further refills. Requested Prescriptions  Pending Prescriptions Disp Refills  . azelastine (ASTELIN) 0.1 % nasal spray 30 mL 2    Sig: Place 1 spray into both nostrils 2 (two) times daily. Use in each nostril as directed     Ear, Nose, and Throat: Nasal Preparations - Antiallergy Passed - 01/01/2021  1:31 PM      Passed - Valid encounter within last 12 months    Recent Outpatient Visits          Today Fever, unspecified fever cause   [facility] Duanne Limerick, MD   11 months ago Establishing care with new doctor, encounter for   [facility] Duanne Limerick, MD   3 years ago Establishing care with new doctor, encounter for   [facility] Duanne Limerick, MD             . ipratropium (ATROVENT) 0.06 % nasal spray 15 mL 12    Sig: Place 2 sprays into both nostrils 4 (four) times daily.     Off-Protocol Failed - 01/01/2021  1:31 PM      Failed - Medication not assigned to a protocol, review manually.      Passed - Valid encounter within last 12 months    Recent Outpatient Visits          Today Fever, unspecified fever cause   [facility] Duanne Limerick, MD   11 months ago Establishing care with new doctor, encounter for   [facility] Duanne Limerick, MD   3 years ago Establishing care with new doctor, encounter for   [facility] Duanne Limerick, MD            Off-Protocol Failed - 01/01/2021  1:31 PM      Failed - Medication not assigned to a protocol, review manually.      Passed - Valid encounter within last 12 months    Recent Outpatient Visits          Today Fever, unspecified fever cause   [facility] Duanne Limerick, MD   11 months ago Establishing care with new doctor, encounter for   [facility] Duanne Limerick, MD   3 years ago Establishing care with new doctor, encounter for   [facility] Duanne Limerick, MD

## 2021-01-01 NOTE — Telephone Encounter (Signed)
Copied from CRM [id number]. Topic: Quick Communication - Rx Refill/Question >> Jan 01, 2021 12:29 PM Gaetana Michaelis A wrote: Medication: azelastine (ASTELIN) 0.1 % nasal spray [[id number]]   ipratropium (ATROVENT) 0.06 % nasal spray [[id number]]   Has the patient contacted their pharmacy? Yes.  The patient has been directed to contact their PCP (Agent: If no, request that the patient contact the pharmacy for the refill. If patient does not wish to contact the pharmacy document the reason why and proceed with request.) (Agent: If yes, when and what did the pharmacy advise?)  Preferred Pharmacy (with phone number or street name): [id number] - [place], [place] - [address] [place] [address]  Phone:  [phone] Fax:  [phone]  Has the patient been seen for an appointment in the last year OR does the patient have an upcoming appointment? Yes.    Agent: Please be advised that RX refills may take up to 3 business days. We ask that you follow-up with your pharmacy.

## 2021-01-02 MED ORDER — IPRATROPIUM BROMIDE 0.06 % NA SOLN: 2.0000 | Freq: Four times a day (QID) | NASAL | 12 refills | Status: DC

## 2021-01-03 ENCOUNTER — Encounter: Payer: Self-pay | Admitting: Family Medicine

## 2021-01-03 ENCOUNTER — Ambulatory Visit
Admission: RE | Admit: 2021-01-03 | Discharge: 2021-01-03 | Disposition: A | Discharge status: Home / Self Care | Payer: Federal, State, Local not specified - PPO | Source: Ambulatory Visit | Attending: Family Medicine | Admitting: Family Medicine

## 2021-01-03 ENCOUNTER — Telehealth: Payer: Self-pay

## 2021-01-03 ENCOUNTER — Ambulatory Visit
Admission: RE | Admit: 2021-01-03 | Discharge: 2021-01-03 | Disposition: A | Discharge status: Home / Self Care | Payer: Federal, State, Local not specified - PPO | Attending: Family Medicine | Admitting: Family Medicine

## 2021-01-03 ENCOUNTER — Ambulatory Visit: Payer: Federal, State, Local not specified - PPO | Admitting: Family Medicine

## 2021-01-03 ENCOUNTER — Other Ambulatory Visit: Payer: Self-pay

## 2021-01-03 VITALS — BP 100/70 | HR 94 | Temp 98.7°F | Ht 73.0 in | Wt 194.0 lb

## 2021-01-03 DIAGNOSIS — R051 Acute cough: Secondary | ICD-10-CM | POA: Diagnosis not present

## 2021-01-03 DIAGNOSIS — J0100 Acute maxillary sinusitis, unspecified: Secondary | ICD-10-CM | POA: Diagnosis not present

## 2021-01-03 DIAGNOSIS — R059 Cough, unspecified: Secondary | ICD-10-CM | POA: Diagnosis not present

## 2021-01-03 DIAGNOSIS — H6983 Other specified disorders of Eustachian tube, bilateral: Secondary | ICD-10-CM

## 2021-01-03 MED ORDER — PREDNISONE 10 MG PO TABS: 10.0000 mg | ORAL_TABLET | Freq: Every day | ORAL | 0 refills | Status: DC

## 2021-01-03 MED ORDER — MONTELUKAST SODIUM 10 MG PO TABS: 10.0000 mg | ORAL_TABLET | Freq: Every day | ORAL | 3 refills | Status: DC

## 2021-01-03 MED ORDER — GUAIFENESIN-CODEINE 100-10 MG/5ML PO SYRP: 5.0000 mL | ORAL_SOLUTION | Freq: Three times a day (TID) | ORAL | 0 refills | Status: DC | PRN

## 2021-01-03 NOTE — Telephone Encounter (Signed)
Called pt to let him know xray is normal, no pneumonia. Continue doxycycline. If not improving by Monday, call us to change medication.

## 2021-01-03 NOTE — Progress Notes (Signed)
Date:  01/03/2021   Name:  Matthew Huynh   DOB:  February 20, 1980   MRN:  [id number]   Chief Complaint: No chief complaint on file.  Cough This is a chronic problem. The current episode started more than 1 year ago. The problem has been gradually worsening. The problem occurs every few minutes. The cough is Productive of purulent sputum. Associated symptoms include ear congestion, headaches, nasal congestion, postnasal drip and rhinorrhea. Pertinent negatives include no chest pain, chills, ear pain, fever, myalgias, rash, sore throat, shortness of breath or wheezing. The treatment provided no relief (on doxy). There is no history of environmental allergies.   No results found for: CREATININE, BUN, NA, K, CL, CO2 No results found for: CHOL, HDL, LDLCALC, LDLDIRECT, TRIG, CHOLHDL No results found for: TSH No results found for: HGBA1C No results found for: WBC, HGB, HCT, MCV, PLT No results found for: ALT, AST, GGT, ALKPHOS, BILITOT No components found for: VITD  Review of Systems  Constitutional:  Negative for chills and fever.  HENT:  Positive for postnasal drip and rhinorrhea. Negative for drooling, ear discharge, ear pain and sore throat.   Respiratory:  Positive for cough. Negative for shortness of breath and wheezing.   Cardiovascular:  Negative for chest pain, palpitations and leg swelling.  Gastrointestinal:  Negative for abdominal pain, blood in stool, constipation, diarrhea and nausea.  Endocrine: Negative for polydipsia.  Genitourinary:  Negative for dysuria, frequency, hematuria and urgency.  Musculoskeletal:  Negative for back pain, myalgias and neck pain.  Skin:  Negative for rash.  Allergic/Immunologic: Negative for environmental allergies.  Neurological:  Positive for headaches. Negative for dizziness.  Hematological:  Does not bruise/bleed easily.  Psychiatric/Behavioral:  Negative for suicidal ideas. The patient is not nervous/anxious.    There are no problems to  display for this patient.   Allergies  Allergen Reactions   Erythromycin    Penicillins    Sulfa Antibiotics     Past Surgical History:  Procedure Laterality Date   COLONOSCOPY WITH PROPOFOL N/A 09/17/2014   Procedure: COLONOSCOPY WITH PROPOFOL;  Surgeon: Josefine Class, MD;  Location: [facility] ENDOSCOPY;  Service: Endoscopy;  Laterality: N/A;    Social History   Tobacco Use   Smoking status: Never   Smokeless tobacco: Never  Vaping Use   Vaping Use: Never used  Substance Use Topics   Alcohol use: Not Currently   Drug use: Never     Medication list has been reviewed and updated.  Current Meds  Medication Sig   azelastine (ASTELIN) 0.1 % nasal spray Place 1 spray into both nostrils 2 (two) times daily. Use in each nostril as directed   doxycycline (VIBRA-TABS) 100 MG tablet Take 1 tablet (100 mg total) by mouth 2 (two) times daily.   Fexofenadine HCl (MUCINEX ALLERGY PO) Take 1 capsule by mouth daily.   ipratropium (ATROVENT) 0.06 % nasal spray Place 2 sprays into both nostrils 4 (four) times daily.    PHQ 2/9 Scores 01/03/2021 01/01/2021 02/06/2020 10/04/2017  PHQ - 2 Score 0 0 0 0  PHQ- 9 Score 0 0 0 0    GAD 7 : Generalized Anxiety Score 01/03/2021 01/01/2021 02/06/2020  Nervous, Anxious, on Edge 0 0 0  Control/stop worrying 0 0 0  Worry too much - different things 0 0 0  Trouble relaxing 0 0 0  Restless 0 0 0  Easily annoyed or irritable 0 0 0  Afraid - awful might happen 0 0 0  Total GAD 7 Score 0 0 0  Anxiety Difficulty - Not difficult at all -    BP Readings from Last 3 Encounters:  01/03/21 100/70  01/01/21 118/78  02/06/20 120/70    Physical Exam Vitals and nursing note reviewed.  HENT:     Head: Normocephalic.     Jaw: There is normal jaw occlusion.     Right Ear: External ear normal. Tympanic membrane is retracted.     Left Ear: External ear normal. Tympanic membrane is retracted.     Nose: Nose normal.     Right Turbinates: Swollen.      Left Turbinates: Swollen.     Mouth/Throat:     Mouth: Mucous membranes are moist. No angioedema.     Pharynx: Posterior oropharyngeal erythema present.  Eyes:     General: No scleral icterus.       Right eye: No discharge.        Left eye: No discharge.     Conjunctiva/sclera: Conjunctivae normal.     Pupils: Pupils are equal, round, and reactive to light.  Neck:     Thyroid: No thyromegaly.     Vascular: No JVD.     Trachea: No tracheal deviation.  Cardiovascular:     Rate and Rhythm: Normal rate and regular rhythm.     Heart sounds: Normal heart sounds. No murmur heard.   No friction rub. No gallop.  Pulmonary:     Effort: No respiratory distress.     Breath sounds: Normal breath sounds. No decreased breath sounds, wheezing, rhonchi or rales.  Abdominal:     General: Bowel sounds are normal.     Palpations: Abdomen is soft. There is no mass.     Tenderness: There is no abdominal tenderness. There is no guarding or rebound.  Musculoskeletal:        General: No tenderness. Normal range of motion.     Cervical back: Normal range of motion and neck supple.  Lymphadenopathy:     Cervical: No cervical adenopathy.  Skin:    General: Skin is warm.     Findings: No rash.  Neurological:     Mental Status: He is alert and oriented to person, place, and time.     Cranial Nerves: No cranial nerve deficit.     Deep Tendon Reflexes: Reflexes are normal and symmetric.    Wt Readings from Last 3 Encounters:  01/03/21 194 lb (88 kg)  01/01/21 194 lb (88 kg)  02/06/20 200 lb (90.7 kg)    BP 100/70   Pulse 94   Temp 98.7 F (37.1 C)   Ht 6\' 1"  (1.854 m)   Wt 194 lb (88 kg)   BMI 25.60 kg/m   Assessment and Plan: 1. Eustachian tube dysfunction, bilateral Chronic.  Controlled.  Stable.  We will initiate Singulair 10 mg once a day and prednisone 10 mg once a day. - montelukast (SINGULAIR) 10 MG tablet; Take 1 tablet (10 mg total) by mouth at bedtime.  Dispense: 30 tablet; Refill:  3  2. Acute non-recurrent maxillary sinusitis Chronic.  Controlled.  Stable.  Currently Ragona continue the doxycycline at 100 mg twice a day will obtain chest x-ray because of the cough and may be making a change based on that in the meantime we will continue the Singulair and have initiated prednisone as well - predniSONE (DELTASONE) 10 MG tablet; Take 1 tablet (10 mg total) by mouth daily with breakfast.  Dispense: 30 tablet; Refill: 0 - DG  Chest 2 View; Future - montelukast (SINGULAIR) 10 MG tablet; Take 1 tablet (10 mg total) by mouth at bedtime.  Dispense: 30 tablet; Refill: 3  3. Acute cough Continued cough that is productive of yellow sputum this is almost a carbon copy of last year's medical concern having been on azithromycin and doxycycline with minimal relief.  Again plan is that we are going to do a chest x-ray stat results if he has pneumonia we may switch over to Levaquin 500 mg once a day otherwise would like to stay the course with just doxycycline given the the warnings of Levaquin due to C. difficile and tenosynovitis. - DG Chest 2 View; Future - guaiFENesin-codeine (ROBITUSSIN AC) 100-10 MG/5ML syrup; Take 5 mLs by mouth 3 (three) times daily as needed for cough.  Dispense: 118 mL; Refill: 0

## 2021-01-06 ENCOUNTER — Ambulatory Visit: Payer: Federal, State, Local not specified - PPO | Admitting: Family Medicine

## 2021-01-06 ENCOUNTER — Encounter: Payer: Self-pay | Admitting: Family Medicine

## 2021-01-06 ENCOUNTER — Other Ambulatory Visit: Payer: Self-pay

## 2021-01-06 VITALS — BP 130/86 | HR 91 | Resp 17 | Ht 73.0 in | Wt 193.6 lb

## 2021-01-06 DIAGNOSIS — J0100 Acute maxillary sinusitis, unspecified: Secondary | ICD-10-CM | POA: Diagnosis not present

## 2021-01-06 DIAGNOSIS — R051 Acute cough: Secondary | ICD-10-CM

## 2021-01-06 MED ORDER — BENZONATATE 100 MG PO CAPS: 100.0000 mg | ORAL_CAPSULE | Freq: Three times a day (TID) | ORAL | 0 refills | Status: DC | PRN

## 2021-01-06 MED ORDER — LEVOFLOXACIN 500 MG PO TABS: 500.0000 mg | ORAL_TABLET | Freq: Every day | ORAL | 0 refills | Status: AC

## 2021-01-06 NOTE — Progress Notes (Signed)
Date:  01/06/2021   Name:  Matthew Huynh   DOB:  1980/02/22   MRN:  [id number]   Chief Complaint: Sinusitis (Sinus drainage, yellowish mucus, ear fullness,post nasal drainage, coughing   and chest congestion  x 5 days)  Sinusitis This is a new problem. The current episode started in the past 7 days. The problem is unchanged. There has been no fever. Associated symptoms include congestion, coughing, a hoarse voice and sinus pressure. Pertinent negatives include no chills, ear pain, headaches, neck pain, shortness of breath or sore throat.   No results found for: CREATININE, BUN, NA, K, CL, CO2 No results found for: CHOL, HDL, LDLCALC, LDLDIRECT, TRIG, CHOLHDL No results found for: TSH No results found for: HGBA1C No results found for: WBC, HGB, HCT, MCV, PLT No results found for: ALT, AST, GGT, ALKPHOS, BILITOT No components found for: VITD  Review of Systems  Constitutional:  Negative for chills and fever.  HENT:  Positive for congestion, hoarse voice and sinus pressure. Negative for drooling, ear discharge, ear pain and sore throat.   Respiratory:  Positive for cough. Negative for shortness of breath and wheezing.   Cardiovascular:  Negative for chest pain, palpitations and leg swelling.  Gastrointestinal:  Negative for abdominal pain, blood in stool, constipation, diarrhea and nausea.  Endocrine: Negative for polydipsia.  Genitourinary:  Negative for dysuria, frequency, hematuria and urgency.  Musculoskeletal:  Negative for back pain, myalgias and neck pain.  Skin:  Negative for rash.  Allergic/Immunologic: Negative for environmental allergies.  Neurological:  Negative for dizziness and headaches.  Hematological:  Does not bruise/bleed easily.  Psychiatric/Behavioral:  Negative for suicidal ideas. The patient is not nervous/anxious.    Patient Active Problem List   Diagnosis Date Noted   COVID-19 01/16/2020    Allergies  Allergen Reactions   Erythromycin    Penicillins     Sulfa Antibiotics     Past Surgical History:  Procedure Laterality Date   COLONOSCOPY WITH PROPOFOL N/A 09/17/2014   Procedure: COLONOSCOPY WITH PROPOFOL;  Surgeon: Josefine Class, MD;  Location: [facility] ENDOSCOPY;  Service: Endoscopy;  Laterality: N/A;    Social History   Tobacco Use   Smoking status: Never   Smokeless tobacco: Never  Vaping Use   Vaping Use: Never used  Substance Use Topics   Alcohol use: Not Currently   Drug use: Never     Medication list has been reviewed and updated.  Current Meds  Medication Sig   azelastine (ASTELIN) 0.1 % nasal spray Place 1 spray into both nostrils 2 (two) times daily. Use in each nostril as directed   Fexofenadine HCl (MUCINEX ALLERGY PO) Take 1 capsule by mouth daily.   ipratropium (ATROVENT) 0.06 % nasal spray Place 2 sprays into both nostrils 4 (four) times daily.   montelukast (SINGULAIR) 10 MG tablet Take 1 tablet (10 mg total) by mouth at bedtime.   predniSONE (DELTASONE) 10 MG tablet Take 1 tablet (10 mg total) by mouth daily with breakfast.    PHQ 2/9 Scores 01/03/2021 01/01/2021 02/06/2020 10/04/2017  PHQ - 2 Score 0 0 0 0  PHQ- 9 Score 0 0 0 0    GAD 7 : Generalized Anxiety Score 01/03/2021 01/01/2021 02/06/2020  Nervous, Anxious, on Edge 0 0 0  Control/stop worrying 0 0 0  Worry too much - different things 0 0 0  Trouble relaxing 0 0 0  Restless 0 0 0  Easily annoyed or irritable 0 0 0  Afraid -  awful might happen 0 0 0  Total GAD 7 Score 0 0 0  Anxiety Difficulty - Not difficult at all -    BP Readings from Last 3 Encounters:  01/06/21 130/86  01/03/21 100/70  01/01/21 118/78    Physical Exam Vitals and nursing note reviewed.  HENT:     Head: Normocephalic.     Right Ear: Tympanic membrane, ear canal and external ear normal.     Left Ear: Tympanic membrane, ear canal and external ear normal.     Nose: Nose normal. No congestion or rhinorrhea.  Eyes:     General: No scleral icterus.       Right  eye: No discharge.        Left eye: No discharge.     Conjunctiva/sclera: Conjunctivae normal.     Pupils: Pupils are equal, round, and reactive to light.  Neck:     Thyroid: No thyromegaly.     Vascular: No JVD.     Trachea: No tracheal deviation.  Cardiovascular:     Rate and Rhythm: Normal rate and regular rhythm.     Heart sounds: Normal heart sounds. No murmur heard.   No friction rub. No gallop.  Pulmonary:     Effort: No respiratory distress.     Breath sounds: Normal breath sounds. No wheezing, rhonchi or rales.  Abdominal:     General: Bowel sounds are normal.     Palpations: Abdomen is soft. There is no mass.     Tenderness: There is no abdominal tenderness. There is no guarding or rebound.  Musculoskeletal:        General: No tenderness. Normal range of motion.     Cervical back: Normal range of motion and neck supple.  Lymphadenopathy:     Cervical: No cervical adenopathy.  Skin:    General: Skin is warm.     Coloration: Skin is not pale.     Findings: No rash.  Neurological:     Mental Status: He is alert and oriented to person, place, and time.     Cranial Nerves: No cranial nerve deficit.     Deep Tendon Reflexes: Reflexes are normal and symmetric.    Wt Readings from Last 3 Encounters:  01/06/21 193 lb 9.6 oz (87.8 kg)  01/03/21 194 lb (88 kg)  01/01/21 194 lb (88 kg)    BP 130/86 (BP Location: Left Arm, Patient Position: Sitting, Cuff Size: Normal)   Pulse 91   Resp 17   Ht 6\' 1"  (1.854 m)   Wt 193 lb 9.6 oz (87.8 kg)   SpO2 99%   BMI 25.54 kg/m   Assessment and Plan:  1. Acute maxillary sinusitis, recurrence not specified New onset.  Persistent.  Just has not progressed in a positive direction and is continued to have a clearing cough most likely of postnasal nature as well and has productive drainage.  We will switch antibiotics to levofloxacin 500 mg once a day for 7 days.  Nasal saline has been suggested as well as continuing expectorant  medication. - levofloxacin (LEVAQUIN) 500 MG tablet; Take 1 tablet (500 mg total) by mouth daily for 7 days.  Dispense: 7 tablet; Refill: 0  2. Acute cough Continued cough and in addition to what is already on we have suggested Tessalon Perles 100 mg 3 times a day as needed. - benzonatate (TESSALON PERLES) 100 MG capsule; Take 1 capsule (100 mg total) by mouth 3 (three) times daily as needed for cough.  Dispense:  20 capsule; Refill: 0

## 2021-02-24 DIAGNOSIS — J301 Allergic rhinitis due to pollen: Secondary | ICD-10-CM | POA: Diagnosis not present

## 2021-02-24 DIAGNOSIS — J342 Deviated nasal septum: Secondary | ICD-10-CM | POA: Diagnosis not present

## 2021-02-24 DIAGNOSIS — J323 Chronic sphenoidal sinusitis: Secondary | ICD-10-CM | POA: Diagnosis not present

## 2021-02-24 DIAGNOSIS — J039 Acute tonsillitis, unspecified: Secondary | ICD-10-CM | POA: Diagnosis not present

## 2021-04-28 ENCOUNTER — Other Ambulatory Visit: Payer: Self-pay

## 2021-04-28 DIAGNOSIS — H6983 Other specified disorders of Eustachian tube, bilateral: Secondary | ICD-10-CM

## 2021-04-28 DIAGNOSIS — J0100 Acute maxillary sinusitis, unspecified: Secondary | ICD-10-CM

## 2021-04-28 MED ORDER — MONTELUKAST SODIUM 10 MG PO TABS: 10.0000 mg | ORAL_TABLET | Freq: Every day | ORAL | 2 refills | Status: DC

## 2021-09-02 ENCOUNTER — Encounter: Payer: Self-pay | Admitting: Family Medicine

## 2021-09-02 ENCOUNTER — Ambulatory Visit: Payer: Federal, State, Local not specified - PPO | Admitting: Family Medicine

## 2021-09-02 VITALS — BP 120/80 | HR 72 | Ht 73.0 in | Wt 193.0 lb

## 2021-09-02 DIAGNOSIS — J309 Allergic rhinitis, unspecified: Secondary | ICD-10-CM | POA: Diagnosis not present

## 2021-09-02 DIAGNOSIS — H6123 Impacted cerumen, bilateral: Secondary | ICD-10-CM | POA: Diagnosis not present

## 2021-09-02 DIAGNOSIS — H6983 Other specified disorders of Eustachian tube, bilateral: Secondary | ICD-10-CM | POA: Diagnosis not present

## 2021-09-02 DIAGNOSIS — R0982 Postnasal drip: Secondary | ICD-10-CM

## 2021-09-02 DIAGNOSIS — J0100 Acute maxillary sinusitis, unspecified: Secondary | ICD-10-CM | POA: Diagnosis not present

## 2021-09-02 MED ORDER — IPRATROPIUM BROMIDE 0.06 % NA SOLN: 2.0000 | Freq: Four times a day (QID) | NASAL | 2 refills | Status: DC

## 2021-09-02 MED ORDER — TRIAMCINOLONE ACETONIDE 55 MCG/ACT NA AERO: 2.0000 | INHALATION_SPRAY | Freq: Every day | NASAL | 12 refills | Status: DC

## 2021-09-02 MED ORDER — CARBAMIDE PEROXIDE 6.5 % OT SOLN: 5.0000 [drp] | Freq: Two times a day (BID) | OTIC | 0 refills | Status: DC

## 2021-09-02 MED ORDER — AZELASTINE HCL 0.1 % NA SOLN: 1.0000 | Freq: Two times a day (BID) | NASAL | 2 refills | Status: DC

## 2021-09-02 MED ORDER — MONTELUKAST SODIUM 10 MG PO TABS: 10.0000 mg | ORAL_TABLET | Freq: Every day | ORAL | 1 refills | Status: DC

## 2021-09-02 MED ORDER — LEVOFLOXACIN 500 MG PO TABS: 500.0000 mg | ORAL_TABLET | Freq: Every day | ORAL | 0 refills | Status: AC

## 2021-09-02 NOTE — Progress Notes (Signed)
Date:  09/02/2021   Name:  Matthew Huynh   DOB:  18-Sep-1980   MRN:  [id number]   Chief Complaint: Sinusitis (Drainage in back of throat, ears clogged up, can't sleep at night d/t drainage- clear to yellow production)  Sinusitis This is a new problem. The current episode started in the past 7 days. There has been no fever. Associated symptoms include congestion, coughing and sinus pressure. Pertinent negatives include no ear pain, shortness of breath or sore throat. Past treatments include antibiotics (doxy). The treatment provided no relief.    No results found for: "NA", "K", "CO2", "GLUCOSE", "BUN", "CREATININE", "CALCIUM", "EGFR", "GFRNONAA" No results found for: "CHOL", "HDL", "LDLCALC", "LDLDIRECT", "TRIG", "CHOLHDL" No results found for: "TSH" No results found for: "HGBA1C" No results found for: "WBC", "HGB", "HCT", "MCV", "PLT" No results found for: "ALT", "AST", "GGT", "ALKPHOS", "BILITOT" No results found for: "25OHVITD2", "25OHVITD3", "VD25OH"   Review of Systems  HENT:  Positive for congestion, sinus pressure and sinus pain. Negative for ear pain and sore throat.   Respiratory:  Positive for cough. Negative for shortness of breath.     Patient Active Problem List   Diagnosis Date Noted   COVID-19 01/16/2020    Allergies  Allergen Reactions   Erythromycin    Penicillins    Sulfa Antibiotics     Past Surgical History:  Procedure Laterality Date   COLONOSCOPY WITH PROPOFOL N/A 09/17/2014   Procedure: COLONOSCOPY WITH PROPOFOL;  Surgeon: Josefine Class, MD;  Location: [facility] ENDOSCOPY;  Service: Endoscopy;  Laterality: N/A;    Social History   Tobacco Use   Smoking status: Never   Smokeless tobacco: Never  Vaping Use   Vaping Use: Never used  Substance Use Topics   Alcohol use: Not Currently   Drug use: Never     Medication list has been reviewed and updated.  Current Meds  Medication Sig   carbamide peroxide (DEBROX) 6.5 % OTIC solution Place 5  drops into both ears 2 (two) times daily.   levofloxacin (LEVAQUIN) 500 MG tablet Take 1 tablet (500 mg total) by mouth daily for 7 days.   triamcinolone (NASACORT) 55 MCG/ACT AERO nasal inhaler Place 2 sprays into the nose daily.   [DISCONTINUED] azelastine (ASTELIN) 0.1 % nasal spray Place 1 spray into both nostrils 2 (two) times daily. Use in each nostril as directed   [DISCONTINUED] ipratropium (ATROVENT) 0.06 % nasal spray Place 2 sprays into both nostrils 4 (four) times daily.   [DISCONTINUED] montelukast (SINGULAIR) 10 MG tablet Take 1 tablet (10 mg total) by mouth at bedtime.       09/02/2021    8:56 AM 01/03/2021   11:06 AM 01/01/2021   11:03 AM 02/06/2020    2:39 PM  GAD 7 : Generalized Anxiety Score  Nervous, Anxious, on Edge 0 0 0 0  Control/stop worrying 0 0 0 0  Worry too much - different things 0 0 0 0  Trouble relaxing 0 0 0 0  Restless 0 0 0 0  Easily annoyed or irritable 0 0 0 0  Afraid - awful might happen 0 0 0 0  Total GAD 7 Score 0 0 0 0  Anxiety Difficulty Not difficult at all  Not difficult at all        09/02/2021    8:56 AM 01/03/2021   11:05 AM 01/01/2021   11:03 AM  Depression screen PHQ 2/9  Decreased Interest 0 0 0  Down, Depressed, Hopeless 0 0  0  PHQ - 2 Score 0 0 0  Altered sleeping 0 0 0  Tired, decreased energy 0 0 0  Change in appetite 0 0 0  Feeling bad or failure about yourself  0 0 0  Trouble concentrating 0 0 0  Moving slowly or fidgety/restless 0 0 0  Suicidal thoughts 0 0 0  PHQ-9 Score 0 0 0  Difficult doing work/chores Not difficult at all Not difficult at all Not difficult at all    BP Readings from Last 3 Encounters:  09/02/21 120/80  01/06/21 130/86  01/03/21 100/70    Physical Exam HENT:     Right Ear: There is impacted cerumen.     Left Ear: There is impacted cerumen.     Nose: Congestion present.     Mouth/Throat:     Mouth: Mucous membranes are moist.     Pharynx: Oropharynx is clear. No oropharyngeal exudate  or posterior oropharyngeal erythema.  Cardiovascular:     Rate and Rhythm: Normal rate.     Heart sounds: No murmur heard.    No gallop.  Pulmonary:     Breath sounds: Normal breath sounds. No wheezing, rhonchi or rales.  Lymphadenopathy:     Cervical: No cervical adenopathy.  Neurological:     Mental Status: He is alert.     Wt Readings from Last 3 Encounters:  09/02/21 193 lb (87.5 kg)  01/06/21 193 lb 9.6 oz (87.8 kg)  01/03/21 194 lb (88 kg)    BP 120/80   Pulse 72   Ht 6' 1"  (1.854 m)   Wt 193 lb (87.5 kg)   BMI 25.46 kg/m   Assessment and Plan:  1. Acute non-recurrent maxillary sinusitis New onset.  Persistent.  Stable.  We will initiate Levaquin 500 mg once a day for 7 days as that this is the only thing that has cleared up signs and infections ingesting in the past.  We will also refill Singulair 10 mg once a day. - montelukast (SINGULAIR) 10 MG tablet; Take 1 tablet (10 mg total) by mouth at bedtime.  Dispense: 90 tablet; Refill: 1 - levofloxacin (LEVAQUIN) 500 MG tablet; Take 1 tablet (500 mg total) by mouth daily for 7 days.  Dispense: 7 tablet; Refill: 0  2. Eustachian tube dysfunction, bilateral Patient has had eustachian tube dysfunction in the past we will refill the Singulair.  We have also suggested using Mucinex DM for cough and enhance nasal drainage. - montelukast (SINGULAIR) 10 MG tablet; Take 1 tablet (10 mg total) by mouth at bedtime.  Dispense: 90 tablet; Refill: 1  3. Allergic rhinitis with postnasal drip Chronic.  Controlled.  Stable.  Patient has refills on all nasal sprays that he has had in the past including Nasacort. - ipratropium (ATROVENT) 0.06 % nasal spray; Place 2 sprays into both nostrils 4 (four) times daily.  Dispense: 15 mL; Refill: 2 - azelastine (ASTELIN) 0.1 % nasal spray; Place 1 spray into both nostrils 2 (two) times daily. Use in each nostril as directed  Dispense: 30 mL; Refill: 2 - triamcinolone (NASACORT) 55 MCG/ACT AERO nasal  inhaler; Place 2 sprays into the nose daily.  Dispense: 1 each; Refill: 12  4. Bilateral impacted cerumen Bilateral cerumen impaction with no visualization of the tympanic membranes.  Patient's been instructed to get a bulb syringe and to use Debrox to the ears prior to showering. - carbamide peroxide (DEBROX) 6.5 % OTIC solution; Place 5 drops into both ears 2 (two) times daily.  Dispense: 15 mL; Refill: 0

## 2021-09-09 ENCOUNTER — Telehealth: Payer: Self-pay

## 2021-09-09 ENCOUNTER — Ambulatory Visit: Payer: Self-pay | Admitting: *Deleted

## 2021-09-09 ENCOUNTER — Other Ambulatory Visit: Payer: Self-pay

## 2021-09-09 DIAGNOSIS — R599 Enlarged lymph nodes, unspecified: Secondary | ICD-10-CM

## 2021-09-09 NOTE — Progress Notes (Signed)
Ref placed to ENT

## 2021-09-09 NOTE — Telephone Encounter (Signed)
Message from [facility] sent at 09/09/2021 11:14 AM EDT  Summary: glands are still swollen and there is a lot of drainage .   Pt stated he was seen on 09/02/2021 pt stated glands are still swollen and there is a lot of drainage .  Pt stated that he finished the antibiotic levofloxacin (LEVAQUIN) 500 MG tablet yesterday.   Pt stated that medication helped with the sinus infection some.   Pt seeking clinical advice.            Chief Complaint: symptoms persist after completing antibiotics yesterday  Symptoms: glands in neck swollen, continue to blow nose for clear to yellow drainage and noted greenish color x 1. Frequency: na  Pertinent Negatives: Patient denies chest pain no difficulty breathing, no fever no sinus pain  Disposition: [] ED /[] Urgent Care (no appt availability in office) / [] Appointment(In office/virtual)/ []  [place] [facility]/ [] Home Care/ [] Refused Recommended Disposition /[] [place] [facility]/ [x]  Follow-up with PCP Additional Notes:   Requesting if additional medication needed due to sx remain after completing levaquin 500 mg yesterday. Does patient need another OV or can medication be prescribed. Last seen 09/02/21 for same sx.      Reason for Disposition  [1] Reasonable improvement on antibiotic AND [2] no fever or pain  Answer Assessment - Initial Assessment Questions 1. ANTIBIOTIC: "What antibiotic are you taking?" "How many times a day?"     Finished levaquin 500 mg yesterday  2. ONSET: "When was the antibiotic started?"     09/02/21 3. PAIN: "How bad is the sinus pain?"   (Scale 1-10; mild, moderate or severe)   - MILD (1-3): doesn't interfere with normal activities    - MODERATE (4-7): interferes with normal activities (e.g., work or school) or awakens from sleep   - SEVERE (8-10): excruciating pain and patient unable to do any normal activities        Glands in neck swollen  4. FEVER: "Do you have a fever?" If Yes, ask: "What is it, how was  it measured, and when did it start?"      no 5. SYMPTOMS: "Are there any other symptoms you're concerned about?" If Yes, ask: "When did it start?"     Blowing nose for clear to yellow mucus  6. PREGNANCY: "Is there any chance you are pregnant?" "When was your last menstrual period?"     na  Protocols used: Sinus Infection on Antibiotic Follow-up Call-A-[facility]

## 2021-09-09 NOTE — Telephone Encounter (Signed)
Spoke to pt concerning his "swollen glands" s/p finishing Levaquin RX. He says he is better, just still has swollen glands that are not better. I offered appt for this afternoon to recheck lungs and evaluate the glands. I also explained that Jones put him on the best antibiotic and he should be better. Pt declined appt, but wants to go to ENT, as JOnes said that would be the next step. Placed ref to ENT

## 2021-09-16 DIAGNOSIS — J301 Allergic rhinitis due to pollen: Secondary | ICD-10-CM | POA: Diagnosis not present

## 2021-09-16 DIAGNOSIS — J323 Chronic sphenoidal sinusitis: Secondary | ICD-10-CM | POA: Diagnosis not present

## 2021-09-16 DIAGNOSIS — J342 Deviated nasal septum: Secondary | ICD-10-CM | POA: Diagnosis not present

## 2021-09-17 DIAGNOSIS — J342 Deviated nasal septum: Secondary | ICD-10-CM | POA: Diagnosis not present

## 2021-10-15 DIAGNOSIS — H6123 Impacted cerumen, bilateral: Secondary | ICD-10-CM | POA: Diagnosis not present

## 2021-10-15 DIAGNOSIS — J301 Allergic rhinitis due to pollen: Secondary | ICD-10-CM | POA: Diagnosis not present

## 2021-10-15 DIAGNOSIS — J323 Chronic sphenoidal sinusitis: Secondary | ICD-10-CM | POA: Diagnosis not present

## 2021-10-15 DIAGNOSIS — J342 Deviated nasal septum: Secondary | ICD-10-CM | POA: Diagnosis not present

## 2021-11-21 IMAGING — CR DG CHEST 2V
2 series · 2 of 2 positions shown · non-contrast
Comparison: Chest x-ray dated March 18, 2019

CLINICAL DATA: Cough

EXAM:
CHEST - 2 VIEW

[chest pa]
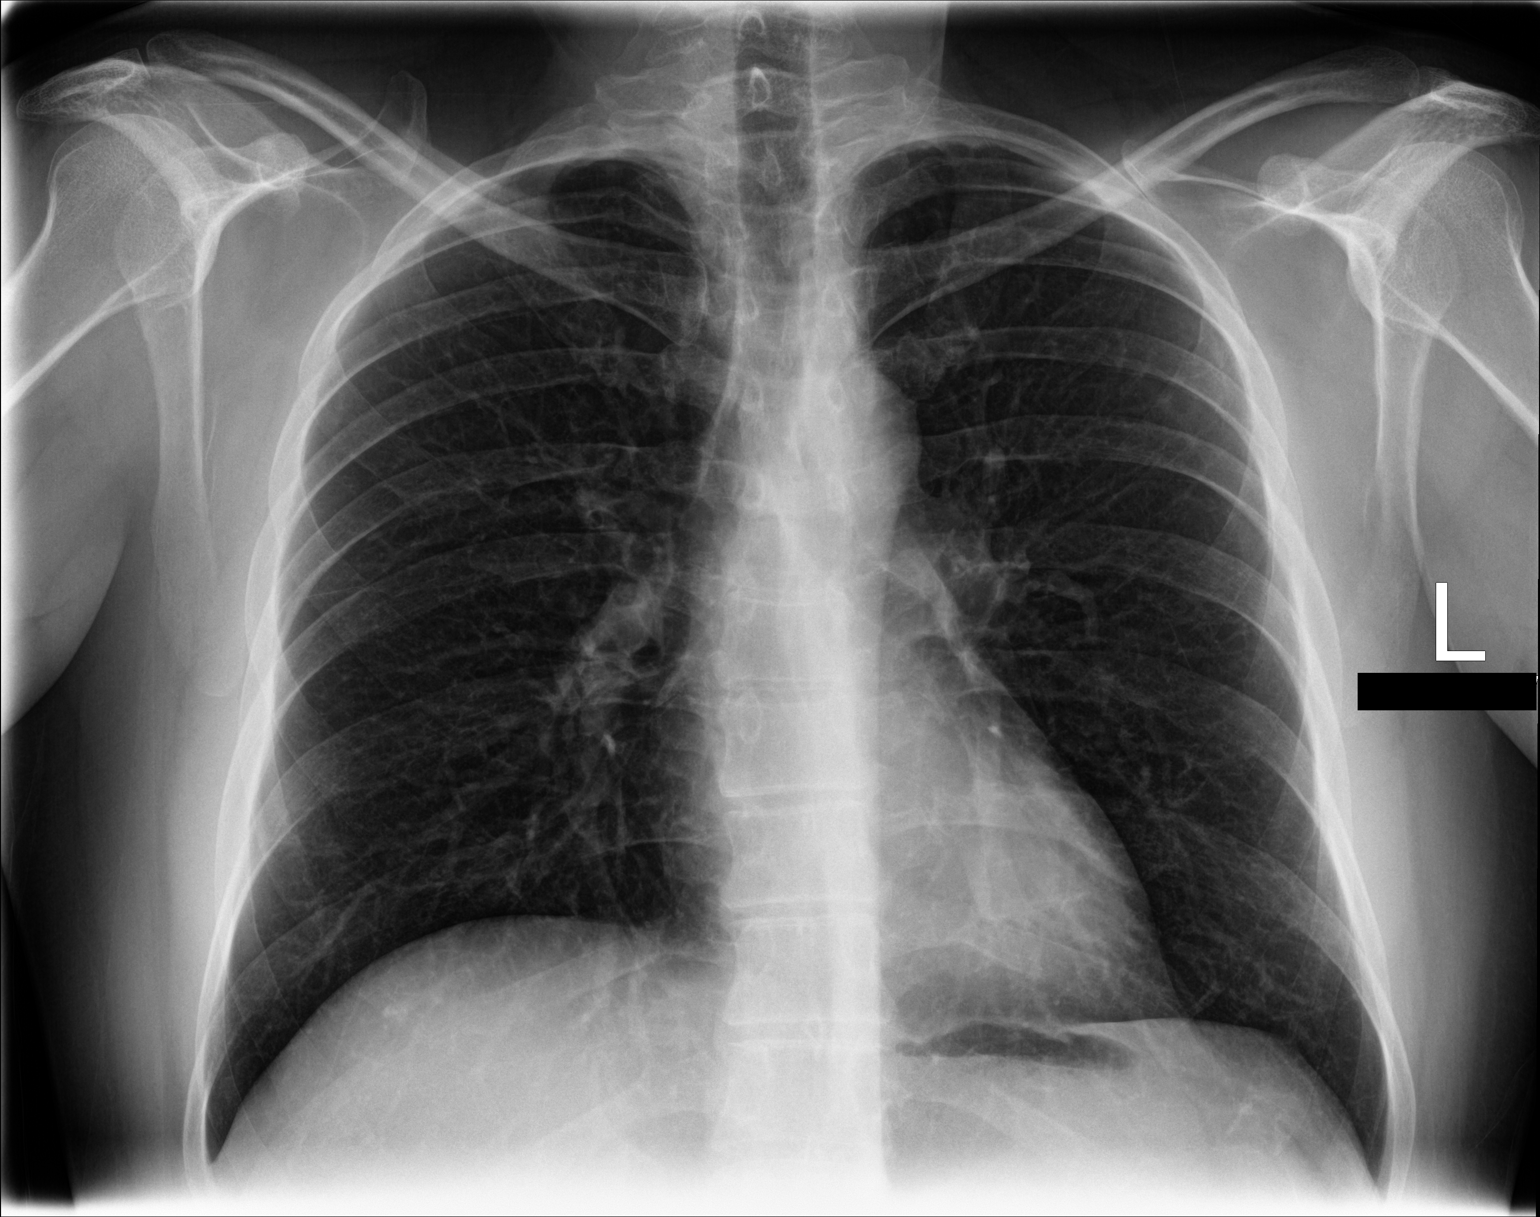

[chest lat]
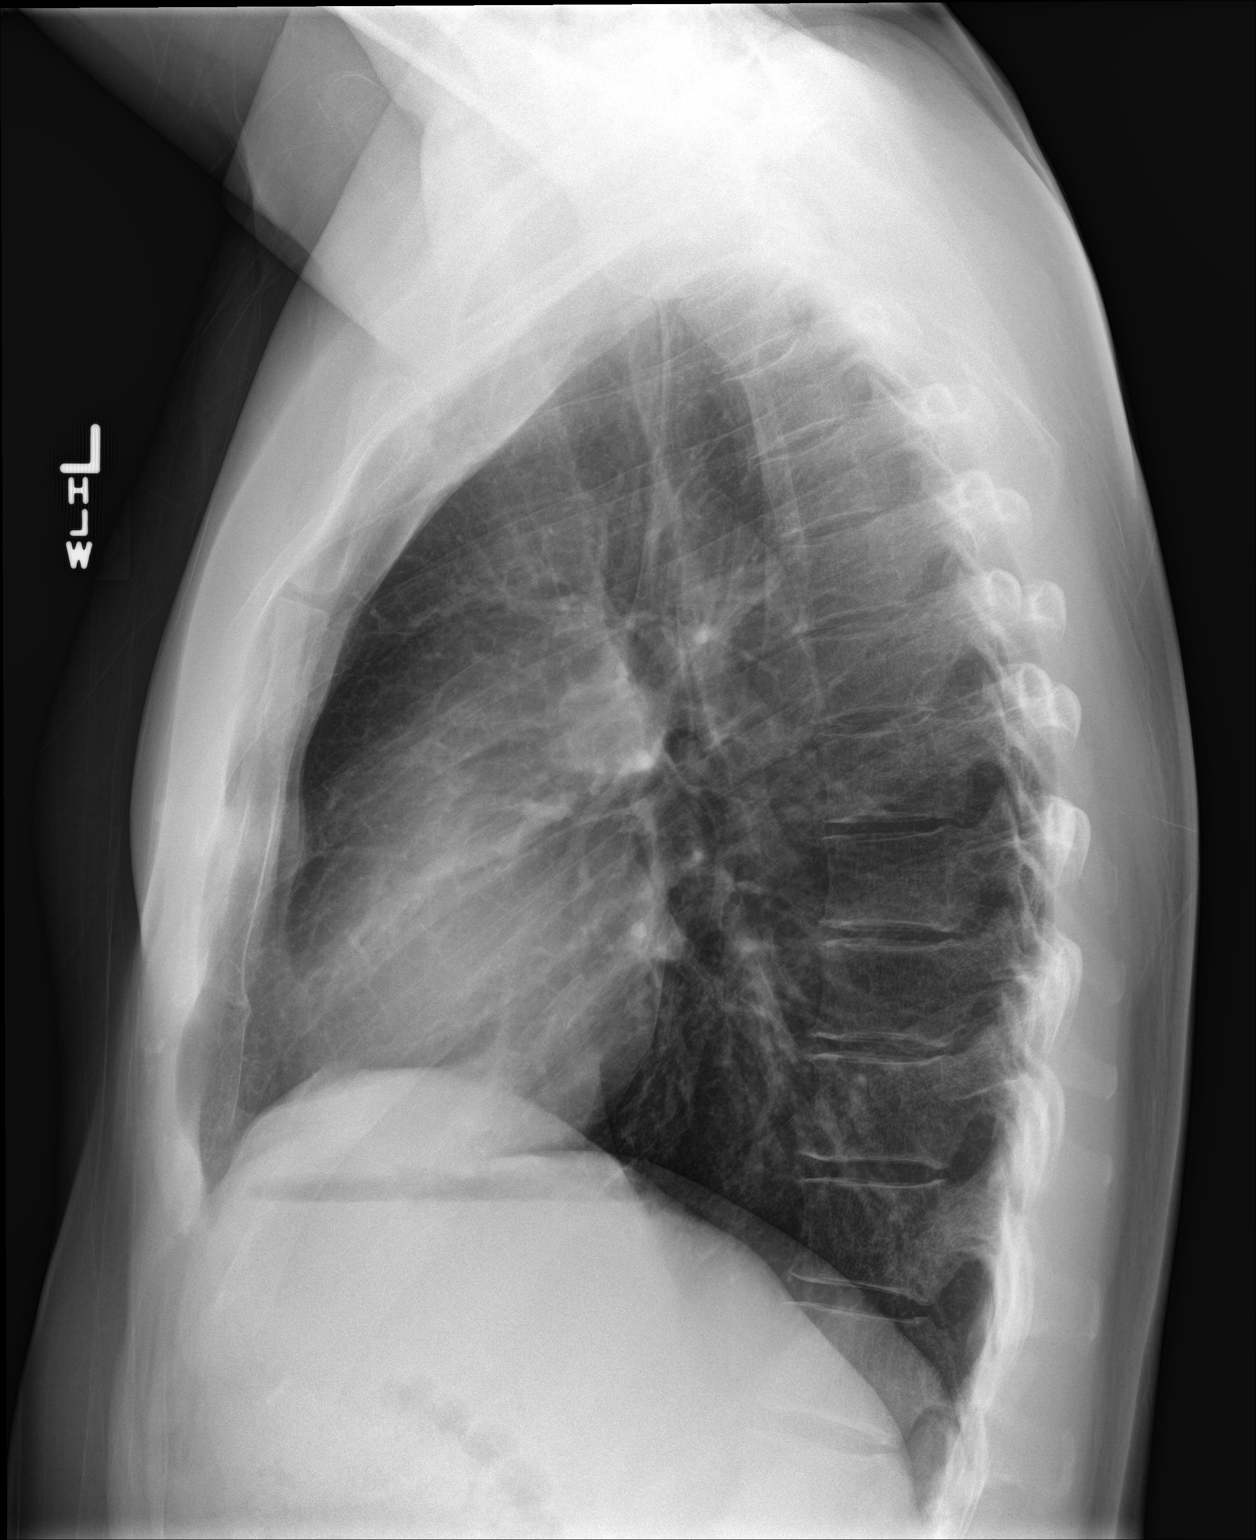

[2 of 2 positions shown; findings below may reference images not displayed]

FINDINGS: The heart size and mediastinal contours are within normal limits.
Both lungs are clear. The visualized skeletal structures are
unremarkable.
IMPRESSION: No active cardiopulmonary disease.

## 2022-01-18 ENCOUNTER — Encounter: Payer: Self-pay | Admitting: Emergency Medicine

## 2022-01-18 ENCOUNTER — Ambulatory Visit
Admission: EM | Admit: 2022-01-18 | Discharge: 2022-01-18 | Disposition: A | Discharge status: Home / Self Care | Payer: Federal, State, Local not specified - PPO | Attending: Family Medicine | Admitting: Family Medicine

## 2022-01-18 DIAGNOSIS — Z1152 Encounter for screening for COVID-19: Secondary | ICD-10-CM | POA: Insufficient documentation

## 2022-01-18 DIAGNOSIS — J029 Acute pharyngitis, unspecified: Secondary | ICD-10-CM

## 2022-01-18 LAB — RESP PANEL BY RT-PCR (FLU A&B, COVID) ARPGX2
Influenza A by PCR: NEGATIVE
Influenza B by PCR: NEGATIVE
SARS Coronavirus 2 by RT PCR: NEGATIVE

## 2022-01-18 LAB — GROUP A STREP BY PCR: Group A Strep by PCR: NOT DETECTED

## 2022-01-18 MED ORDER — LIDOCAINE VISCOUS HCL 2 % MT SOLN: 15.0000 mL | OROMUCOSAL | 0 refills | Status: DC | PRN

## 2022-01-18 NOTE — ED Provider Notes (Signed)
[facility]    CSN: [id number] Arrival date & time: 01/18/22  0944      History   Chief Complaint Chief Complaint  Patient presents with   Sore Throat    HPI Matthew Huynh is a 41 y.o. male.   HPI   Matthew Huynh presents for sore throat and has some swelling on the right side. Symptoms started about 4 days ago. Ate some cough drops with relief. Has painful swallowing. Took Theraflu without relief.    Fever : no  Chills: no Sore throat: yes Cough: no Sputum: no Nasal congestion : no  Rhinorrhea: yes Myalgias: yes Appetite: normal  Hydration: normal  Abdominal pain: no Nausea: no Vomiting: no Diarrhea: No Rash: No Sleep disturbance: yes Headache: no Neck pain: yes in the front  Ear pain: yes mildly       Past Medical History:  Diagnosis Date   Allergy     Patient Active Problem List   Diagnosis Date Noted   COVID-19 01/16/2020    Past Surgical History:  Procedure Laterality Date   COLONOSCOPY WITH PROPOFOL N/A 09/17/2014   Procedure: COLONOSCOPY WITH PROPOFOL;  Surgeon: Elnita Maxwell, MD;  Location: [facility] ENDOSCOPY;  Service: Endoscopy;  Laterality: N/A;       Home Medications    Prior to Admission medications   Medication Sig Start Date End Date Taking? Authorizing Provider  lidocaine (XYLOCAINE) 2 % solution Use as directed 15 mLs in the mouth or throat as needed for mouth pain. 01/18/22  Yes Janiqua Friscia, DO  azelastine (ASTELIN) 0.1 % nasal spray Place 1 spray into both nostrils 2 (two) times daily. Use in each nostril as directed 09/02/21   Duanne Limerick, MD  carbamide peroxide (DEBROX) 6.5 % OTIC solution Place 5 drops into both ears 2 (two) times daily. 09/02/21   Duanne Limerick, MD  Fexofenadine HCl [facility] ALLERGY PO) Take 1 capsule by mouth daily. Patient not taking: Reported on 09/02/2021    [provider]  ipratropium (ATROVENT) 0.06 % nasal spray Place 2 sprays into both nostrils 4 (four) times daily.  09/02/21   Duanne Limerick, MD  montelukast (SINGULAIR) 10 MG tablet Take 1 tablet (10 mg total) by mouth at bedtime. 09/02/21   Duanne Limerick, MD  triamcinolone (NASACORT) 55 MCG/ACT AERO nasal inhaler Place 2 sprays into the nose daily. 09/02/21   Duanne Limerick, MD  fluticasone (FLONASE) 50 MCG/ACT nasal spray Place 2 sprays into both nostrils daily. 12/26/19 01/09/20  Domenick Gong, MD    Family History Family History  Problem Relation Age of Onset   Healthy Mother    Healthy Father    Cancer Maternal Grandmother    Heart disease Paternal Grandfather     Social History Social History   Tobacco Use   Smoking status: Never   Smokeless tobacco: Never  Vaping Use   Vaping Use: Never used  Substance Use Topics   Alcohol use: Not Currently   Drug use: Never     Allergies   Erythromycin, Penicillins, and Sulfa antibiotics   Review of Systems Review of Systems: negative unless otherwise stated in HPI.      Physical Exam Triage Vital Signs ED Triage Vitals  Enc Vitals Group     BP 01/18/22 1020 (!) 129/91     Pulse Rate 01/18/22 1020 66     Resp 01/18/22 1020 15     Temp 01/18/22 1020 98.3 F (36.8 C)  Temp Source 01/18/22 1020 Oral     SpO2 01/18/22 1020 97 %     Weight 01/18/22 1017 185 lb (83.9 kg)     Height 01/18/22 1017 6\' 1"  (1.854 m)     Head Circumference --      Peak Flow --      Pain Score 01/18/22 1017 5     Pain Loc --      Pain Edu? --      Excl. in [place]? --    No data found.  Updated Vital Signs BP (!) 129/91 (BP Location: Left Arm)   Pulse 66   Temp 98.3 F (36.8 C) (Oral)   Resp 15   Ht 6\' 1"  (1.854 m)   Wt 83.9 kg   SpO2 97%   BMI 24.41 kg/m   Visual Acuity Right Eye Distance:   Left Eye Distance:   Bilateral Distance:    Right Eye Near:   Left Eye Near:    Bilateral Near:     Physical Exam GEN:     alert, non-toxic appearing male in no distress    HENT:  mucus membranes moist, oropharyngeal without lesions or  exudate, 2+ tonsillar hypertrophy, moderate oropharyngeal erythema, bilateral TM normal EYES:   pupils equal and reactive, no scleral injection or discharge NECK:  normal ROM, no lymphadenopathy, no meningismus   RESP:  no increased work of breathing, clear to auscultation bilaterally CVS:   regular rate and rhythm Skin:   warm and dry, no rash on visible skin    UC Treatments / Results  Labs (all labs ordered are listed, but only abnormal results are displayed) Labs Reviewed  GROUP A STREP BY PCR  RESP PANEL BY RT-PCR (FLU A&B, COVID) [id number]    EKG   Radiology No results found.  Procedures Procedures (including critical care time)  Medications Ordered in UC Medications - No data to display  Initial Impression / Assessment and Plan / UC Course  I have reviewed the triage vital signs and the nursing notes.  Pertinent labs & imaging results that were available during my care of the patient were reviewed by me and considered in my medical decision making (see chart for details).       Pt is a 41 y.o. male who presents for 4 days of respiratory symptoms. Matthew Huynh is afebrile here without recent antipyretics. Satting well on room air. Overall pt is non-toxic appearing, well hydrated, without respiratory distress. Pulmonary exam is unremarkable.  COVID and influenza testing obtained and was negative. Strep PCR negative. History consistent with viral respiratory illness. Discussed symptomatic treatment.  Lidocaine viscous for painful swallowing. Explained lack of efficacy of antibiotics in viral disease.  Typical duration of symptoms discussed.   Return and ED precautions given and patient/guardian voiced understanding. Discussed MDM, treatment plan and plan for follow-up with patient who agrees with plan.     Final Clinical Impressions(s) / UC Diagnoses   Final diagnoses:  Pharyngitis, unspecified etiology     Discharge Instructions      We will contact you if your  strep/COVID/influenza test is positive. You will see your results in MyChart no matter what.    You can take Tylenol and/or Ibuprofen as needed for fever reduction and pain relief.    For cough: honey 1/2 to 1 teaspoon (you can dilute the honey in water or another fluid).  You can also use guaifenesin and dextromethorphan for cough. You can use a humidifier for chest congestion and cough.  If you don't have a humidifier, you can sit in the bathroom with the hot shower running.      For sore throat: try warm salt water gargles at least 4 times a day, Mucinex sore throat or cepacol cough drops, Chloraseptic throat spray, warm tea or water with lemon/honey, popsicles or ice, or OTC cold relief medicine for throat discomfort.    For congestion: take a daily anti-histamine like Zyrtec, Claritin, and a oral decongestant, such as pseudoephedrine.  You can also use Flonase 1-2 sprays in each nostril daily.    It is important to stay hydrated: drink plenty of fluids (water, gatorade/powerade/pedialyte, juices, or teas) to keep your throat moisturized and help further relieve irritation/discomfort.    Return or go to the Emergency Department if symptoms worsen or do not improve in the next few days      ED Prescriptions     Medication Sig Dispense Auth. Provider   lidocaine (XYLOCAINE) 2 % solution Use as directed 15 mLs in the mouth or throat as needed for mouth pain. 100 mL Lyndee Hensen, DO      PDMP not reviewed this encounter.   Lyndee Hensen, DO 01/18/22 1344

## 2022-01-18 NOTE — ED Triage Notes (Signed)
Patient c/o sore throat and swollen glands for the past 4 days.  Patient denies fevers.

## 2022-01-18 NOTE — Discharge Instructions (Addendum)
We will contact you if your strep/COVID/influenza test is positive. You will see your results in MyChart no matter what.    You can take Tylenol and/or Ibuprofen as needed for fever reduction and pain relief.    For cough: honey 1/2 to 1 teaspoon (you can dilute the honey in water or another fluid).  You can also use guaifenesin and dextromethorphan for cough. You can use a humidifier for chest congestion and cough.  If you don't have a humidifier, you can sit in the bathroom with the hot shower running.      For sore throat: try warm salt water gargles at least 4 times a day, Mucinex sore throat or cepacol cough drops, Chloraseptic throat spray, warm tea or water with lemon/honey, popsicles or ice, or OTC cold relief medicine for throat discomfort.    For congestion: take a daily anti-histamine like Zyrtec, Claritin, and a oral decongestant, such as pseudoephedrine.  You can also use Flonase 1-2 sprays in each nostril daily.    It is important to stay hydrated: drink plenty of fluids (water, gatorade/powerade/pedialyte, juices, or teas) to keep your throat moisturized and help further relieve irritation/discomfort.    Return or go to the Emergency Department if symptoms worsen or do not improve in the next few days

## 2022-03-17 ENCOUNTER — Other Ambulatory Visit: Payer: Self-pay

## 2022-03-17 DIAGNOSIS — J0100 Acute maxillary sinusitis, unspecified: Secondary | ICD-10-CM

## 2022-03-17 DIAGNOSIS — H6993 Unspecified Eustachian tube disorder, bilateral: Secondary | ICD-10-CM

## 2022-03-17 MED ORDER — MONTELUKAST SODIUM 10 MG PO TABS: 10.0000 mg | ORAL_TABLET | Freq: Every day | ORAL | 0 refills | Status: DC

## 2022-10-06 ENCOUNTER — Encounter: Payer: Self-pay | Admitting: Family Medicine

## 2022-10-06 ENCOUNTER — Ambulatory Visit: Payer: Federal, State, Local not specified - PPO | Admitting: Family Medicine

## 2022-10-06 VITALS — BP 118/78 | HR 78 | Ht 73.0 in | Wt 199.0 lb

## 2022-10-06 DIAGNOSIS — K921 Melena: Secondary | ICD-10-CM | POA: Diagnosis not present

## 2022-10-06 DIAGNOSIS — K648 Other hemorrhoids: Secondary | ICD-10-CM | POA: Diagnosis not present

## 2022-10-06 DIAGNOSIS — R194 Change in bowel habit: Secondary | ICD-10-CM | POA: Diagnosis not present

## 2022-10-06 NOTE — Patient Instructions (Signed)
High-Fiber Eating Plan Fiber, also called dietary fiber, is found in foods such as fruits, vegetables, whole grains, and beans. A high-fiber diet can be good for your health. Your health care provider may recommend a high-fiber diet to help: Prevent trouble pooping (constipation). Lower your cholesterol. Treat the following conditions: Hemorrhoids. This is inflammation of veins in the anus. Inflammation of specific areas of the digestive tract. Irritable bowel syndrome (IBS). This is a problem of the large intestine, also called the colon, that sometimes causes belly pain and bloating. Prevent overeating as part of a weight-loss plan. Lower the risk of heart disease, type 2 diabetes, and certain cancers. What are tips for following this plan? Reading food labels  Check the nutrition facts label on foods for the amount of dietary fiber. Choose foods that have 4 grams of fiber or more per serving. The recommended goals for how much fiber you should eat each day include: Males 31 years old or younger: 30-34 g. Males over 66 years old: 28-34 g. Females 19 years old or younger: 25-28 g. Females over 34 years old: 22-25 g. Your daily fiber goal is _____________ g. Shopping Choose whole fruits and vegetables instead of processed. For example, choose apples instead of apple juice or applesauce. Choose a variety of high-fiber foods such as avocados, lentils, oats, and pinto beans. Read the nutrition facts label on foods. Check for foods with added fiber. These foods often have high sugar and salt (sodium) amounts per serving. Cooking Use whole-grain flour for baking and cooking. Cook with brown rice instead of white rice. Make meals that have a lot of beans and vegetables in them, such as chili or vegetable-based soups. Meal planning Start the day with a breakfast that is high in fiber, such as a cereal that has 5 g of fiber or more per serving. Eat breads and cereals that are made with  whole-grain flour instead of refined flour or white flour. Eat brown rice, bulgur wheat, or millet instead of white rice. Use beans in place of meat in soups, salads, and pasta dishes. Be sure that half of the grains you eat each day are whole grains. General information You can get the recommended amount of dietary fiber by: Eating a variety of fruits, vegetables, grains, nuts, and beans. Taking a fiber supplement if you aren't able to eat enough fiber. It's better to get fiber through food than from a supplement. Slowly increase how much fiber you eat. If you increase the amount of fiber you eat too quickly, you may have bloating, cramping, or gas. Drink plenty of water to help you digest fiber. Choose high-fiber snacks, such as berries, raw vegetables, nuts, and popcorn. What foods should I eat? Fruits Berries. Pears. Apples. Oranges. Avocado. Prunes and raisins. Dried figs. Vegetables Sweet potatoes. Spinach. Kale. Artichokes. Cabbage. Broccoli. Cauliflower. Green peas. Carrots. Squash. Grains Whole-grain breads. Multigrain cereal. Oats and oatmeal. Brown rice. Barley. Bulgur wheat. Millet. Quinoa. Bran muffins. Popcorn. Rye wafer crackers. Meats and other proteins Navy beans, kidney beans, and pinto beans. Soybeans. Split peas. Lentils. Nuts and seeds. Dairy Fiber-fortified yogurt. Fortified means that fiber has been added to the product. Beverages Fiber-fortified soy milk. Fiber-fortified orange juice. Other foods Fiber bars. The items listed above may not be all the foods and drinks you can have. Talk to a dietitian to learn more. What foods should I avoid? Fruits Fruit juice. Cooked, strained fruit. Vegetables Fried potatoes. Canned vegetables. Well-cooked vegetables. Grains White bread. Pasta made with refined  flour. White rice. Meats and other proteins Fatty meat. Fried chicken or fried fish. Dairy Milk. Cream cheese. Sour cream. Fats and  oils Butters. Beverages Soft drinks. Other foods Cakes and pastries. The items listed above may not be all the foods and drinks you should avoid. Talk to a dietitian to learn more. This information is not intended to replace advice given to you by your health care provider. Make sure you discuss any questions you have with your health care provider. Document Revised: 04/27/2022 Document Reviewed: 04/27/2022 Elsevier Patient Education  2024 [employer] Constipation, Adult Constipation is when a person has fewer than three bowel movements in a week, has difficulty having a bowel movement, or has stools (feces) that are dry, hard, or larger than normal. Constipation may be caused by an underlying condition. It may become worse with age if a person takes certain medicines and does not take in enough fluids. Follow these instructions at home: Eating and drinking  Eat foods that have a lot of fiber, such as beans, whole grains, and fresh fruits and vegetables. Limit foods that are low in fiber and high in fat and processed sugars, such as fried or sweet foods. These include french fries, hamburgers, cookies, candies, and soda. Drink enough fluid to keep your urine pale yellow. General instructions Exercise regularly or as told by your health care provider. Try to do 150 minutes of moderate exercise each week. Use the bathroom when you have the urge to go. Do not hold it in. Take over-the-counter and prescription medicines only as told by your health care provider. This includes any fiber supplements. During bowel movements: Practice deep breathing while relaxing the lower abdomen. Practice pelvic floor relaxation. Watch your condition for any changes. Let your health care provider know about them. Keep all follow-up visits as told by your health care provider. This is important. Contact a health care provider if: You have pain that gets worse. You have a fever. You do not have a bowel  movement after 4 days. You vomit. You are not hungry or you lose weight. You are bleeding from the opening between the buttocks (anus). You have thin, pencil-like stools. Get help right away if: You have a fever and your symptoms suddenly get worse. You leak stool or have blood in your stool. Your abdomen is bloated. You have severe pain in your abdomen. You feel dizzy or you faint. Summary Constipation is when a person has fewer than three bowel movements in a week, has difficulty having a bowel movement, or has stools (feces) that are dry, hard, or larger than normal. Eat foods that have a lot of fiber, such as beans, whole grains, and fresh fruits and vegetables. Drink enough fluid to keep your urine pale yellow. Take over-the-counter and prescription medicines only as told by your health care provider. This includes any fiber supplements. This information is not intended to replace advice given to you by your health care provider. Make sure you discuss any questions you have with your health care provider. Document Revised: 12/17/2021 Document Reviewed: 12/17/2021 Elsevier Patient Education  2024 [employer].

## 2022-10-06 NOTE — Progress Notes (Unsigned)
Date:  10/06/2022   Name:  Matthew Huynh   DOB:  09-Sep-1980   MRN:  [id number]   Chief Complaint: Constipation (Goes to BR every night, some hard and some no- takes hour to 2 to go to BR ) and Hemorrhoids (X 2 months- external- out "most of the time" Extra Strength Tylenol helps)  Constipation This is a new problem. The current episode started more than 1 year ago. The problem has been waxing and waning since onset. The stool is described as formed. The patient is not on a high fiber diet. He Does not exercise regularly. There has Not been adequate water intake. Associated symptoms include flatus, hematochezia and hemorrhoids. Pertinent negatives include no abdominal pain, back pain, bloating, diarrhea, difficulty urinating, fever, melena, rectal pain or weight loss. He has tried nothing for the symptoms. His past medical history is significant for irritable bowel syndrome. There is no history of abdominal surgery or inflammatory bowel disease.    No results found for: "NA", "K", "CO2", "GLUCOSE", "BUN", "CREATININE", "CALCIUM", "EGFR", "GFRNONAA" No results found for: "CHOL", "HDL", "LDLCALC", "LDLDIRECT", "TRIG", "CHOLHDL" No results found for: "TSH" No results found for: "HGBA1C" No results found for: "WBC", "HGB", "HCT", "MCV", "PLT" No results found for: "ALT", "AST", "GGT", "ALKPHOS", "BILITOT" No results found for: "25OHVITD2", "25OHVITD3", "VD25OH"   Review of Systems  Constitutional:  Negative for fever and weight loss.  Gastrointestinal:  Positive for constipation, flatus, hematochezia and hemorrhoids. Negative for abdominal pain, bloating, diarrhea, melena and rectal pain.  Genitourinary:  Negative for difficulty urinating.  Musculoskeletal:  Negative for back pain.    Patient Active Problem List   Diagnosis Date Noted   COVID-19 01/16/2020    Allergies  Allergen Reactions   Erythromycin    Penicillins    Sulfa Antibiotics     Past Surgical History:  Procedure  Laterality Date   COLONOSCOPY WITH PROPOFOL N/A 09/17/2014   Procedure: COLONOSCOPY WITH PROPOFOL;  Surgeon: Elnita Maxwell, MD;  [facility] ENDOSCOPY;  Service: Endoscopy;  Laterality: N/A;    Social History   Tobacco Use   Smoking status: Never   Smokeless tobacco: Never  Vaping Use   Vaping status: Never Used  Substance Use Topics   Alcohol use: Not Currently   Drug use: Never     Medication list has been reviewed and updated.  Current Meds  Medication Sig   montelukast (SINGULAIR) 10 MG tablet Take 1 tablet (10 mg total) by mouth at bedtime.   [DISCONTINUED] azelastine (ASTELIN) 0.1 % nasal spray Place 1 spray into both nostrils 2 (two) times daily. Use in each nostril as directed   [DISCONTINUED] carbamide peroxide (DEBROX) 6.5 % OTIC solution Place 5 drops into both ears 2 (two) times daily.       10/06/2022    4:14 PM 09/02/2021    8:56 AM 01/03/2021   11:06 AM 01/01/2021   11:03 AM  GAD 7 : Generalized Anxiety Score  Nervous, Anxious, on Edge 0 0 0 0  Control/stop worrying 0 0 0 0  Worry too much - different things 0 0 0 0  Trouble relaxing 0 0 0 0  Restless 0 0 0 0  Easily annoyed or irritable 0 0 0 0  Afraid - awful might happen 0 0 0 0  Total GAD 7 Score 0 0 0 0  Anxiety Difficulty Not difficult at all Not difficult at all  Not difficult at all       10/06/2022  4:13 PM 09/02/2021    8:56 AM 01/03/2021   11:05 AM  Depression screen PHQ 2/9  Decreased Interest 0 0 0  Down, Depressed, Hopeless 0 0 0  PHQ - 2 Score 0 0 0  Altered sleeping 0 0 0  Tired, decreased energy 0 0 0  Change in appetite 0 0 0  Feeling bad or failure about yourself  0 0 0  Trouble concentrating 0 0 0  Moving slowly or fidgety/restless 0 0 0  Suicidal thoughts 0 0 0  PHQ-9 Score 0 0 0  Difficult doing work/chores Not difficult at all Not difficult at all Not difficult at all    BP Readings from Last 3 Encounters:  10/06/22 118/78  01/18/22 (!) 129/91  09/02/21  120/80    Physical Exam Vitals and nursing note reviewed.  HENT:     Head: Normocephalic.     Right Ear: External ear normal.     Left Ear: External ear normal.     Nose: Nose normal.  Eyes:     General: No scleral icterus.       Right eye: No discharge.        Left eye: No discharge.     Conjunctiva/sclera: Conjunctivae normal.     Pupils: Pupils are equal, round, and reactive to light.  Neck:     Thyroid: No thyromegaly.     Vascular: No JVD.     Trachea: No tracheal deviation.  Cardiovascular:     Rate and Rhythm: Normal rate and regular rhythm.     Heart sounds: Normal heart sounds. No murmur heard.    No friction rub. No gallop.  Pulmonary:     Effort: No respiratory distress.     Breath sounds: Normal breath sounds. No wheezing or rales.  Abdominal:     General: Bowel sounds are normal. There is no distension.     Palpations: Abdomen is soft. There is no mass.     Tenderness: There is no abdominal tenderness. There is no guarding or rebound.  Genitourinary:    Prostate: Normal. Not enlarged, not tender and no nodules present.     Rectum: Guaiac result negative. External hemorrhoid and internal hemorrhoid present. No mass, tenderness or anal fissure. Normal anal tone.  Musculoskeletal:        General: No tenderness. Normal range of motion.     Cervical back: Normal range of motion and neck supple.  Lymphadenopathy:     Cervical: No cervical adenopathy.  Skin:    General: Skin is warm.     Findings: No rash.  Neurological:     Mental Status: He is alert and oriented to person, place, and time.     Cranial Nerves: No cranial nerve deficit.     Deep Tendon Reflexes: Reflexes are normal and symmetric.     Wt Readings from Last 3 Encounters:  10/06/22 199 lb (90.3 kg)  01/18/22 185 lb (83.9 kg)  09/02/21 193 lb (87.5 kg)    BP 118/78   Pulse 78   Ht 6\' 1"  (1.854 m)   Wt 199 lb (90.3 kg)   SpO2 98%   BMI 26.25 kg/m   Assessment and Plan:  1. Change in  bowel habit Patient states that he has had difficulty with bowel movements for 8 years but his definition of normalcy is a bowel movement every day for which she has to sit for 1 to 2 hours on some days to have a bowel movement.  I  did discuss the medical definition of constipation is less than 3 bowel movements a week and that as we get older we have a slow down the transit time however extensive this has been a change most recently and with the additional blood that is noted in the toilet we will be referring to gastroenterology for evaluation in the meantime I have suggested that the patient begin taking Metamucil on a daily basis and have given him information on high-fiber diet.  2. Other hemorrhoids There is some external hemorrhoids that are noted and there is some palpable internal hemorrhoids but there as he describes it as tubular.  I am not sure if they are wartlike but we will have gastroenterology take a look if they are external they will be helped by using more fiber in the diet and I have discussed the trepidation of removing external hemorrhoids and the ease of which internal hemorrhoids can be treated the this is an issue this will be brought to the attention of the gastroenterologist. - Ambulatory referral to Gastroenterology  3. Hematochezia Patient has noted more blood in the toilet not incorporated in the bowel movement but it is "a lot "without clots.  I have a feeling that there may be some microtears or irritation of the hemorrhoids themselves patient has been treating with Preparation H and lidocaine but has not really been from the painful aspect.  I do feel that there is a possibility of rectal fissures and this would be easier to do noted by direct visualization by gastroenterology. - Ambulatory referral to Gastroenterology    Elizabeth Sauer, MD

## 2022-10-13 ENCOUNTER — Telehealth: Payer: Self-pay | Admitting: Gastroenterology

## 2022-10-13 NOTE — Telephone Encounter (Signed)
Patient wife Matthew Huynh) called in to schedule her husband colonoscopy. I advised her that we will need a verbal consent because he don't have an DPR.

## 2022-12-01 ENCOUNTER — Encounter: Payer: Self-pay | Admitting: Gastroenterology

## 2022-12-01 ENCOUNTER — Ambulatory Visit: Payer: Federal, State, Local not specified - PPO | Admitting: Gastroenterology

## 2022-12-01 VITALS — BP 129/86 | HR 69 | Temp 98.3°F | Ht 72.0 in | Wt 197.0 lb

## 2022-12-01 DIAGNOSIS — K649 Unspecified hemorrhoids: Secondary | ICD-10-CM | POA: Diagnosis not present

## 2022-12-01 DIAGNOSIS — K5900 Constipation, unspecified: Secondary | ICD-10-CM

## 2022-12-01 MED ORDER — LINACLOTIDE 145 MCG PO CAPS: 145.0000 ug | ORAL_CAPSULE | Freq: Every day | ORAL | Status: AC

## 2022-12-01 NOTE — Progress Notes (Signed)
Gastroenterology Consultation  Referring Provider:     Duanne Limerick, MD Primary Care Physician:  Duanne Limerick, MD Primary Gastroenterologist:  Dr. Servando Snare     Reason for Consultation:     Constipation        HPI:   Matthew Huynh is a 42 y.o. y/o male referred for consultation & management of constipation by Dr. Yetta Barre, Vanita Panda, MD. this patient comes in today with a history of constipation.  The patient states that his constipation has been so bad that has been having trouble with his hemorrhoids.  He reports them to be painful.  The patient had been seen in the past at [facility] and did not like the way he was being treated there for transfer his care to [facility].  The patient had issues with diarrhea and was being followed by them for the diarrhea.  The patient states the diarrhea resolved but his issue now is the constipation.  He reports that he does have a bowel movement once a day.  There is no report of any unexplained weight loss, fevers, chills, nausea or vomiting.  The patient states that he has a very irregular schedule and tried to take Metamucil but states that it was very difficult since shortly after putting in the water and turned into a gel if he did not drink it fast enough.  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  The patient had a colonoscopy in 2018 that was normal.  Past Medical History:  Diagnosis Date   Allergy     Past Surgical History:  Procedure Laterality Date   COLONOSCOPY WITH PROPOFOL N/A 09/17/2014   Procedure: COLONOSCOPY WITH PROPOFOL;  Surgeon: Elnita Maxwell, MD;  Location: [facility] ENDOSCOPY;  Service: Endoscopy;  Laterality: N/A;    Prior to Admission medications   Medication Sig Start Date End Date Taking? Authorizing Provider  acetaminophen (TYLENOL) 500 MG tablet Take 500 mg by mouth every 6 (six) hours as needed.   Yes [provider]  montelukast (SINGULAIR) 10  MG tablet Take 1 tablet (10 mg total) by mouth at bedtime. 03/17/22  Yes Duanne Limerick, MD  fluticasone (FLONASE) 50 MCG/ACT nasal spray Place 2 sprays into both nostrils daily. 12/26/19 01/09/20  Domenick Gong, MD    Family History  Problem Relation Age of Onset   Healthy Mother    Healthy Father    Cancer Maternal Grandmother    Heart disease Paternal Grandfather      Social History   Tobacco Use   Smoking status: Never   Smokeless tobacco: Never  Vaping Use   Vaping status: Never Used  Substance Use Topics   Alcohol use: Not Currently   Drug use: Never    Allergies as of 12/01/2022 - Review Complete 12/01/2022  Allergen Reaction Noted   Erythromycin  12/26/2019   Penicillins  09/14/2014   Sulfa antibiotics  09/14/2014    Review of Systems:    All systems reviewed and negative except where noted in HPI.   Physical Exam:  BP 129/86 (BP Location: Left Arm, Patient Position: Sitting, Cuff Size: Normal)   Pulse 69   Temp 98.3 F (36.8 C) (Oral)   Ht 6' (1.829 m)   Wt 197 lb (89.4 kg)   BMI 26.72 kg/m  No LMP for male patient. General:   Alert,  Well-developed, well-nourished, pleasant and cooperative in NAD Head:  Normocephalic and atraumatic. Eyes:  Sclera clear, no icterus.   Conjunctiva pink. Ears:  Normal auditory acuity. Neck:  Supple; no masses or thyromegaly. Neurologic:  Alert and oriented x3;  grossly normal neurologically. Skin:  Intact without significant lesions or rashes.  No jaundice. Lymph Nodes:  No significant cervical adenopathy. Psych:  Alert and cooperative. Normal mood and affect.  Imaging Studies: No results found.  Assessment and Plan:   Matthew Huynh is a 42 y.o. y/o male who comes in today with constipation and resulting enlarged hemorrhoids which are quite bothersome for him.  The patient has been told that we should try to treated medically before we discuss doing anything surgically or with banding.  The patient has been given  samples of Linzess 145 mcg to be taken daily.  The patient has been explained to take the medication half hour before he eats.  He has also been told that he can try to add MiraLAX if this does not help.  The patient will contact me if his symptoms do not improve.  The patient has been explained the plan and agrees with it.    Midge Minium, MD. Clementeen Graham    Note: This dictation was prepared with Dragon dictation along with smaller phrase technology. Any transcriptional errors that result from this process are unintentional.

## 2022-12-01 NOTE — Patient Instructions (Signed)
Linzess samples given

## 2023-11-24 ENCOUNTER — Other Ambulatory Visit: Payer: Self-pay | Admitting: Family Medicine

## 2023-11-24 DIAGNOSIS — J0100 Acute maxillary sinusitis, unspecified: Secondary | ICD-10-CM

## 2023-11-24 DIAGNOSIS — H6993 Unspecified Eustachian tube disorder, bilateral: Secondary | ICD-10-CM

## 2023-11-24 NOTE — Telephone Encounter (Unsigned)
 Copied from CRM [id number]. Topic: Clinical - Medication Refill >> Nov 24, 2023  4:39 PM Tobias L wrote: Medication: montelukast  (SINGULAIR ) 10 MG tablet Patient has scheduled upcoming appointment with [facility] for 12/09/2023. Patient requesting courtesy refill until able to be seen.   Has the patient contacted their pharmacy? Yes Told to reach out to office for refills.  This is the patient's preferred pharmacy:  [id number], [place] - [address] AT [facility] OF [address] [facility] [place] [address] Phone: [phone] Fax: [phone]  Is this the correct pharmacy for this prescription? Yes  Has the prescription been filled recently? No  Is the patient out of the medication? Yes  Has the patient been seen for an appointment in the last year OR does the patient have an upcoming appointment? Yes  Can we respond through MyChart? No  Agent: Please be advised that Rx refills may take up to 3 business days. We ask that you follow-up with your pharmacy.

## 2023-11-26 NOTE — Telephone Encounter (Signed)
 Requested medications are due for refill today.  yes  Requested medications are on the active medications list.  yes  Last refill. 03/17/2022 #[id number] [phone] [id number]  Future visit scheduled.   yes  Notes to clinic.  Pt is requesting a courtesy refill to last until appt. Pt last seen 10/06/2022 - Dr. Joshua listed as PCP.    Requested Prescriptions  Pending Prescriptions Disp Refills   montelukast  (SINGULAIR ) 10 MG tablet 90 tablet 0    Sig: Take 1 tablet (10 mg total) by mouth at bedtime.     Pulmonology:  Leukotriene Inhibitors Failed - 11/26/2023  2:00 PM      Failed - Valid encounter within last 12 months    Recent Outpatient Visits   None

## 2023-12-09 ENCOUNTER — Ambulatory Visit: Admitting: Physician Assistant

## 2023-12-09 ENCOUNTER — Encounter: Payer: Self-pay | Admitting: Physician Assistant

## 2023-12-09 VITALS — BP 122/82 | HR 76 | Temp 98.4°F | Ht 73.0 in | Wt 204.0 lb

## 2023-12-09 DIAGNOSIS — J323 Chronic sphenoidal sinusitis: Secondary | ICD-10-CM | POA: Insufficient documentation

## 2023-12-09 DIAGNOSIS — R198 Other specified symptoms and signs involving the digestive system and abdomen: Secondary | ICD-10-CM | POA: Insufficient documentation

## 2023-12-09 DIAGNOSIS — R030 Elevated blood-pressure reading, without diagnosis of hypertension: Secondary | ICD-10-CM | POA: Insufficient documentation

## 2023-12-09 DIAGNOSIS — J342 Deviated nasal septum: Secondary | ICD-10-CM | POA: Insufficient documentation

## 2023-12-09 DIAGNOSIS — H6993 Unspecified Eustachian tube disorder, bilateral: Secondary | ICD-10-CM | POA: Insufficient documentation

## 2023-12-09 DIAGNOSIS — J309 Allergic rhinitis, unspecified: Secondary | ICD-10-CM | POA: Insufficient documentation

## 2023-12-09 DIAGNOSIS — K649 Unspecified hemorrhoids: Secondary | ICD-10-CM | POA: Insufficient documentation

## 2023-12-09 MED ORDER — MONTELUKAST SODIUM 10 MG PO TABS
10.0000 mg | ORAL_TABLET | Freq: Every day | ORAL | 3 refills | Status: AC
Start: 2023-12-09 — End: ?

## 2023-12-09 NOTE — Progress Notes (Signed)
 Date:  12/09/2023   Name:  Matthew Huynh   DOB:  1980/08/23   MRN:  [id number]   Chief Complaint: Transitions Of Care  HPI  Matthew Huynh is a pleasant 43 year old male with a history of chronic bowel irregularity, hemorrhoids, allergic rhinitis, deviated septum, chronic sinusitis, eustachian tube dysfunction who presents new to me today as a transfer of care from my recently retired Animator Dr. Cathryne Molt.  He has not been seen by our clinic in 14 months.  He is simply requesting medication refill on montelukast  which works very well to control his allergy symptoms. He has been without the medication for about a month. He does not have any other concerns to address today. Offered routine physical but he defers today.   Medication list has been reviewed and updated.  Current Meds  Medication Sig   acetaminophen (TYLENOL) 500 MG tablet Take 500 mg by mouth every 6 (six) hours as needed.   linaclotide  (LINZESS ) 145 MCG CAPS capsule Take 1 capsule (145 mcg total) by mouth daily before breakfast.   [DISCONTINUED] montelukast  (SINGULAIR ) 10 MG tablet Take 1 tablet (10 mg total) by mouth at bedtime.     Review of Systems  Patient Active Problem List   Diagnosis Date Noted   Chronic sphenoidal sinusitis 12/09/2023   Deviated nasal septum 12/09/2023   Allergic rhinitis 12/09/2023   Elevated blood pressure reading in office without diagnosis of hypertension 12/09/2023   Hemorrhoids 12/09/2023   Irregular bowel habits 12/09/2023   Eustachian tube dysfunction, bilateral 12/09/2023    Allergies  Allergen Reactions   Erythromycin    Penicillins    Sulfa Antibiotics     Immunization History  Administered Date(s) Administered   Moderna Sars-Covid-2 Vaccination 07/10/2019, 08/06/2019    Past Surgical History:  Procedure Laterality Date   COLONOSCOPY WITH PROPOFOL  N/A 09/17/2014   Procedure: COLONOSCOPY WITH PROPOFOL ;  Surgeon: Donnice Vaughn Manes, MD;  Location: [facility];   Service: Endoscopy;  Laterality: N/A;    Social History   Tobacco Use   Smoking status: Never   Smokeless tobacco: Never  Vaping Use   Vaping status: Never Used  Substance Use Topics   Alcohol use: Not Currently   Drug use: Never    Family History  Problem Relation Age of Onset   Healthy Mother    Healthy Father    Cancer Maternal Grandmother    Heart disease Paternal Grandfather         12/09/2023    3:39 PM 10/06/2022    4:14 PM 09/02/2021    8:56 AM 01/03/2021   11:06 AM  GAD 7 : Generalized Anxiety Score  Nervous, Anxious, on Edge 0 0 0 0  Control/stop worrying 0 0 0 0  Worry too much - different things 0 0 0 0  Trouble relaxing 0 0 0 0  Restless 0 0 0 0  Easily annoyed or irritable 0 0 0 0  Afraid - awful might happen 0 0 0 0  Total GAD 7 Score 0 0 0 0  Anxiety Difficulty Not difficult at all Not difficult at all Not difficult at all        12/09/2023    3:39 PM 10/06/2022    4:13 PM 09/02/2021    8:56 AM  Depression screen PHQ 2/9  Decreased Interest 0 0 0  Down, Depressed, Hopeless 0 0 0  PHQ - 2 Score 0 0 0  Altered sleeping  0 0  Tired, decreased energy  0 0  Change in appetite  0 0  Feeling bad or failure about yourself   0 0  Trouble concentrating  0 0  Moving slowly or fidgety/restless  0 0  Suicidal thoughts  0 0  PHQ-9 Score  0 0  Difficult doing work/chores  Not difficult at all Not difficult at all    BP Readings from Last 3 Encounters:  12/09/23 122/82  12/01/22 129/86  10/06/22 118/78    Wt Readings from Last 3 Encounters:  12/09/23 204 lb (92.5 kg)  12/01/22 197 lb (89.4 kg)  10/06/22 199 lb (90.3 kg)    BP 122/82 (Cuff Size: Large)   Pulse 76   Temp 98.4 F (36.9 C)   Ht 6' 1 (1.854 m)   Wt 204 lb (92.5 kg)   SpO2 97%   BMI 26.91 kg/m   Physical Exam Vitals and nursing note reviewed.  Constitutional:      Appearance: Normal appearance.  Cardiovascular:     Rate and Rhythm: Normal rate.  Pulmonary:     Effort:  Pulmonary effort is normal.  Abdominal:     General: There is no distension.  Musculoskeletal:        General: Normal range of motion.  Skin:    General: Skin is warm and dry.  Neurological:     Mental Status: He is alert and oriented to person, place, and time.     Gait: Gait is intact.  Psychiatric:        Mood and Affect: Mood and affect normal.     Recent Labs  No results found for: NA, K, CL, CO2, GLUCOSE, BUN, CREATININE, CALCIUM, PROT, ALBUMIN, AST, ALT, ALKPHOS, BILITOT, GFRNONAA, GFRAA  No results found for: WBC, HGB, HCT, MCV, PLT No results found for: HGBA1C No results found for: CHOL, HDL, LDLCALC, LDLDIRECT, TRIG, CHOLHDL No results found for: TSH    Assessment and Plan:  Eustachian tube dysfunction, bilateral Assessment & Plan: Refill montelukast  for the year.   Orders: -     Montelukast  Sodium; Take 1 tablet (10 mg total) by mouth at bedtime.  Dispense: 90 tablet; Refill: 3     F/u in 2026 for routine CPE with labs. Patient says he will have to check his calendar and make an appointment later.    Rolan Hoyle, PA-C, DMSc, Nutritionist [facility] [phone]

## 2023-12-09 NOTE — Assessment & Plan Note (Signed)
 Refill montelukast  for the year.
